# Patient Record
Sex: Female | Born: 1985 | Race: Black or African American | Hispanic: No | Marital: Single | Smoking: Never smoker
Health system: Southern US, Community
[De-identification: ages and names within clinical notes are randomized; demographics above are authoritative.]

## PROBLEM LIST (undated history)

## (undated) DIAGNOSIS — F32A Depression, unspecified: Secondary | ICD-10-CM

## (undated) DIAGNOSIS — F419 Anxiety disorder, unspecified: Secondary | ICD-10-CM

## (undated) DIAGNOSIS — Z789 Other specified health status: Secondary | ICD-10-CM

## (undated) DIAGNOSIS — I10 Essential (primary) hypertension: Secondary | ICD-10-CM

## (undated) HISTORY — DX: Anxiety disorder, unspecified: F41.9

## (undated) HISTORY — PX: WISDOM TOOTH EXTRACTION: SHX21

## (undated) HISTORY — DX: Depression, unspecified: F32.A

## (undated) HISTORY — PX: PILONIDAL CYST EXCISION: SHX744

## (undated) HISTORY — DX: Essential (primary) hypertension: I10

---
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
## 2017-03-20 ENCOUNTER — Other Ambulatory Visit (HOSPITAL_COMMUNITY): Payer: Self-pay | Admitting: Unknown Physician Specialty

## 2017-03-23 ENCOUNTER — Ambulatory Visit (HOSPITAL_COMMUNITY)
Admission: RE | Admit: 2017-03-23 | Discharge: 2017-03-23 | Disposition: A | Discharge status: Home / Self Care | Payer: 59 | Source: Ambulatory Visit | Attending: Unknown Physician Specialty | Admitting: Unknown Physician Specialty

## 2017-03-23 ENCOUNTER — Encounter (HOSPITAL_COMMUNITY): Payer: Self-pay | Admitting: *Deleted

## 2017-03-23 ENCOUNTER — Other Ambulatory Visit (HOSPITAL_COMMUNITY): Payer: Self-pay | Admitting: Unknown Physician Specialty

## 2017-03-23 DIAGNOSIS — Z3A24 24 weeks gestation of pregnancy: Secondary | ICD-10-CM | POA: Insufficient documentation

## 2017-03-23 DIAGNOSIS — Z363 Encounter for antenatal screening for malformations: Secondary | ICD-10-CM | POA: Diagnosis present

## 2017-03-23 DIAGNOSIS — O99212 Obesity complicating pregnancy, second trimester: Secondary | ICD-10-CM | POA: Insufficient documentation

## 2017-03-23 DIAGNOSIS — Z3689 Encounter for other specified antenatal screening: Secondary | ICD-10-CM

## 2017-03-23 DIAGNOSIS — Z3686 Encounter for antenatal screening for cervical length: Secondary | ICD-10-CM | POA: Diagnosis not present

## 2017-03-23 DIAGNOSIS — O26872 Cervical shortening, second trimester: Secondary | ICD-10-CM | POA: Diagnosis not present

## 2017-03-23 DIAGNOSIS — O3440 Maternal care for other abnormalities of cervix, unspecified trimester: Secondary | ICD-10-CM | POA: Insufficient documentation

## 2017-03-23 HISTORY — DX: Other specified health status: Z78.9

## 2017-03-24 ENCOUNTER — Encounter (HOSPITAL_COMMUNITY): Payer: Self-pay

## 2017-03-25 ENCOUNTER — Other Ambulatory Visit (HOSPITAL_COMMUNITY): Payer: Self-pay

## 2017-03-25 ENCOUNTER — Encounter (HOSPITAL_COMMUNITY): Payer: Self-pay

## 2017-10-01 ENCOUNTER — Encounter (HOSPITAL_COMMUNITY): Payer: Self-pay

## 2020-04-15 ENCOUNTER — Ambulatory Visit: Payer: Self-pay

## 2021-01-08 ENCOUNTER — Ambulatory Visit: Payer: Self-pay | Admitting: Pediatrics

## 2021-01-15 ENCOUNTER — Ambulatory Visit: Payer: 59 | Admitting: Nurse Practitioner

## 2021-01-15 ENCOUNTER — Encounter: Payer: Self-pay | Admitting: Nurse Practitioner

## 2021-01-15 ENCOUNTER — Other Ambulatory Visit: Payer: Self-pay

## 2021-01-15 VITALS — BP 105/68 | HR 78 | Temp 97.8°F | Ht 70.0 in | Wt 314.0 lb

## 2021-01-15 DIAGNOSIS — Z6841 Body Mass Index (BMI) 40.0 and over, adult: Secondary | ICD-10-CM | POA: Diagnosis not present

## 2021-01-15 DIAGNOSIS — F339 Major depressive disorder, recurrent, unspecified: Secondary | ICD-10-CM | POA: Diagnosis not present

## 2021-01-15 DIAGNOSIS — Z0000 Encounter for general adult medical examination without abnormal findings: Secondary | ICD-10-CM | POA: Insufficient documentation

## 2021-01-15 DIAGNOSIS — F419 Anxiety disorder, unspecified: Secondary | ICD-10-CM | POA: Insufficient documentation

## 2021-01-15 MED ORDER — SERTRALINE HCL 100 MG PO TABS: 100.0000 mg | ORAL_TABLET | Freq: Every day | ORAL | 3 refills | Status: DC

## 2021-01-15 MED ORDER — BUPROPION HCL ER (XL) 150 MG PO TB24: 150.0000 mg | ORAL_TABLET | Freq: Every day | ORAL | 3 refills | Status: DC

## 2021-01-15 NOTE — Assessment & Plan Note (Signed)
Patient had weight loss surgery last year and has lost almost 100 pounds.  I continue to provide education and reinforcement, exercise and healthy diet recommended.

## 2021-01-15 NOTE — Assessment & Plan Note (Addendum)
Completed physical exam, head to toe assessment, education provided to patient on health maintenance and preventative care. Printed hand out given. Follow up in one year.   Labs completed today-CBC, CMP, lipid panel,.

## 2021-01-15 NOTE — Patient Instructions (Signed)
Completed annual physical exam today, return in 1 year for physical.  Started patient back on Zoloft 100 mg tablet daily and Wellbutrin 150 mg tablet daily.  Follow-up 6 weeks reassess depression/anxiety Health Maintenance, Female Adopting a healthy lifestyle and getting preventive care are important in promoting health and wellness. Ask your health care provider about: The right schedule for you to have regular tests and exams. Things you can do on your own to prevent diseases and keep yourself healthy. What should I know about diet, weight, and exercise? Eat a healthy diet  Eat a diet that includes plenty of vegetables, fruits, low-fat dairy products, and lean protein. Do not eat a lot of foods that are high in solid fats, added sugars, or sodium.  Maintain a healthy weight Body mass index (BMI) is used to identify weight problems. It estimates body fat based on height and weight. Your health care provider can help determineyour BMI and help you achieve or maintain a healthy weight. Get regular exercise Get regular exercise. This is one of the most important things you can do for your health. Most adults should: Exercise for at least 150 minutes each week. The exercise should increase your heart rate and make you sweat (moderate-intensity exercise). Do strengthening exercises at least twice a week. This is in addition to the moderate-intensity exercise. Spend less time sitting. Even light physical activity can be beneficial. Watch cholesterol and blood lipids Have your blood tested for lipids and cholesterol at 34 years of age, then havethis test every 5 years. Have your cholesterol levels checked more often if: Your lipid or cholesterol levels are high. You are older than 34 years of age. You are at high risk for heart disease. What should I know about cancer screening? Depending on your health history and family history, you may need to have cancer screening at various ages. This may  include screening for: Breast cancer. Cervical cancer. Colorectal cancer. Skin cancer. Lung cancer. What should I know about heart disease, diabetes, and high blood pressure? Blood pressure and heart disease High blood pressure causes heart disease and increases the risk of stroke. This is more likely to develop in people who have high blood pressure readings, are of African descent, or are overweight. Have your blood pressure checked: Every 3-5 years if you are 16-27 years of age. Every year if you are 62 years old or older. Diabetes Have regular diabetes screenings. This checks your fasting blood sugar level. Have the screening done: Once every three years after age 43 if you are at a normal weight and have a low risk for diabetes. More often and at a younger age if you are overweight or have a high risk for diabetes. What should I know about preventing infection? Hepatitis B If you have a higher risk for hepatitis B, you should be screened for this virus. Talk with your health care provider to find out if you are at risk forhepatitis B infection. Hepatitis C Testing is recommended for: Everyone born from [birth year] through 1964. Anyone with known risk factors for hepatitis C. Sexually transmitted infections (STIs) Get screened for STIs, including gonorrhea and chlamydia, if: You are sexually active and are younger than 34 years of age. You are older than 34 years of age and your health care provider tells you that you are at risk for this type of infection. Your sexual activity has changed since you were last screened, and you are at increased risk for chlamydia or gonorrhea. Ask your  health care provider if you are at risk. Ask your health care provider about whether you are at high risk for HIV. Your health care provider may recommend a prescription medicine to help prevent HIV infection. If you choose to take medicine to prevent HIV, you should first get tested for HIV. You should then be  tested every 3 months for as long as you are taking the medicine. Pregnancy If you are about to stop having your period (premenopausal) and you may become pregnant, seek counseling before you get pregnant. Take 400 to 800 micrograms (mcg) of folic acid every day if you become pregnant. Ask for birth control (contraception) if you want to prevent pregnancy. Osteoporosis and menopause Osteoporosis is a disease in which the bones lose minerals and strength with aging. This can result in bone fractures. If you are 7 years old or older, or if you are at risk for osteoporosis and fractures, ask your health care provider if you should: Be screened for bone loss. Take a calcium or vitamin D supplement to lower your risk of fractures. Be given hormone replacement therapy (HRT) to treat symptoms of menopause. Follow these instructions at home: Lifestyle Do not use any products that contain nicotine or tobacco, such as cigarettes, e-cigarettes, and chewing tobacco. If you need help quitting, ask your health care provider. Do not use street drugs. Do not share needles. Ask your health care provider for help if you need support or information about quitting drugs. Alcohol use Do not drink alcohol if: Your health care provider tells you not to drink. You are pregnant, may be pregnant, or are planning to become pregnant. If you drink alcohol: Limit how much you use to 0-1 drink a day. Limit intake if you are breastfeeding. Be aware of how much alcohol is in your drink. In the [place] one drink equals one 12 oz bottle of beer (355 mL), one 5 oz glass of wine (148 mL), or one 1 oz glass of hard liquor (44 mL). General instructions Schedule regular health, dental, and eye exams. Stay current with your vaccines. Tell your health care provider if: You often feel depressed. You have ever been abused or do not feel safe at home. Summary Adopting a healthy lifestyle and getting preventive care are important  in promoting health and wellness. Follow your health care provider's instructions about healthy diet, exercising, and getting tested or screened for diseases. Follow your health care provider's instructions on monitoring your cholesterol and blood pressure. This information is not intended to replace advice given to you by your health care provider. Make sure you discuss any questions you have with your healthcare provider. Document Revised: 07/07/2018 Document Reviewed: 07/07/2018 Elsevier Patient Education  2022 Elsevier Inc. http://[email or web address].NIH.Gov">  Generalized Anxiety Disorder, Adult Generalized anxiety disorder (GAD) is a mental health condition. Unlike normal worries, anxiety related to GAD is not triggered by a specific event. These worries do not fade or get better with time. GAD interferes with relationships,work, and school. GAD symptoms can vary from mild to severe. People with severe GAD can have intense waves of anxiety with physical symptoms that are similar to panicattacks. What are the causes? The exact cause of GAD is not known, but the following are believed to have an impact: Differences in natural brain chemicals. Genes passed down from parents to children. Differences in the way threats are perceived. Development during childhood. Personality. What increases the risk? The following factors may make you more likely to develop this condition: Being  female. Having a family history of anxiety disorders. Being very shy. Experiencing very stressful life events, such as the death of a loved one. Having a very stressful family environment. What are the signs or symptoms? People with GAD often worry excessively about many things in their lives, such as their health and family. Symptoms may also include: Mental and emotional symptoms: Worrying excessively about natural disasters. Fear of being late. Difficulty concentrating. Fears that others are judging your  performance. Physical symptoms: Fatigue. Headaches, muscle tension, muscle twitches, trembling, or feeling shaky. Feeling like your heart is pounding or beating very fast. Feeling out of breath or like you cannot take a deep breath. Having trouble falling asleep or staying asleep, or experiencing restlessness. Sweating. Nausea, diarrhea, or irritable bowel syndrome (IBS). Behavioral symptoms: Experiencing erratic moods or irritability. Avoidance of new situations. Avoidance of people. Extreme difficulty making decisions. How is this diagnosed? This condition is diagnosed based on your symptoms and medical history. You will also have a physical exam. Your health care provider may perform tests torule out other possible causes of your symptoms. To be diagnosed with GAD, a person must have anxiety that: Is out of his or her control. Affects several different aspects of his or her life, such as work and relationships. Causes distress that makes him or her unable to take part in normal activities. Includes at least three symptoms of GAD, such as restlessness, fatigue, trouble concentrating, irritability, muscle tension, or sleep problems. Before your health care provider can confirm a diagnosis of GAD, these symptoms must be present more days than they are not, and they must last for 6 months orlonger. How is this treated? This condition may be treated with: Medicine. Antidepressant medicine is usually prescribed for long-term daily control. Anti-anxiety medicines may be added in severe cases, especially when panic attacks occur. Talk therapy (psychotherapy). Certain types of talk therapy can be helpful in treating GAD by providing support, education, and guidance. Options include: Cognitive behavioral therapy (CBT). People learn coping skills and self-calming techniques to ease their physical symptoms. They learn to identify unrealistic thoughts and behaviors and to replace them with more  appropriate thoughts and behaviors. Acceptance and commitment therapy (ACT). This treatment teaches people how to be mindful as a way to cope with unwanted thoughts and feelings. Biofeedback. This process trains you to manage your body's response (physiological response) through breathing techniques and relaxation methods. You will work with a therapist while machines are used to monitor your physical symptoms. Stress management techniques. These include yoga, meditation, and exercise. A mental health specialist can help determine which treatment is best for you. Some people see improvement with one type of therapy. However, other peoplerequire a combination of therapies. Follow these instructions at home: Lifestyle Maintain a consistent routine and schedule. Anticipate stressful situations. Create a plan, and allow extra time to work with your plan. Practice stress management or self-calming techniques that you have learned from your therapist or your health care provider. General instructions Take over-the-counter and prescription medicines only as told by your health care provider. Understand that you are likely to have setbacks. Accept this and be kind to yourself as you persist to take better care of yourself. Recognize and accept your accomplishments, even if you judge them as small. Keep all follow-up visits as told by your health care provider. This is important. Contact a health care provider if: Your symptoms do not get better. Your symptoms get worse. You have signs of depression, such as:  A persistently sad or irritable mood. Loss of enjoyment in activities that used to bring you joy. Change in weight or eating. Changes in sleeping habits. Avoiding friends or family members. Loss of energy for normal tasks. Feelings of guilt or worthlessness. Get help right away if: You have serious thoughts about hurting yourself or others. If you ever feel like you may hurt yourself or  others, or have thoughts about taking your own life, get help right away. Go to your nearest emergency department or: Call your local emergency services (911 in the [place] Call a suicide crisis helpline, such as the National Suicide Prevention Lifeline at 920-858-14451-(613)384-9976. This is open 24 hours a day in the [place] Text the Crisis Text Line at (534)149-7248741741 (in the [place] Summary Generalized anxiety disorder (GAD) is a mental health condition that involves worry that is not triggered by a specific event. People with GAD often worry excessively about many things in their lives, such as their health and family. GAD may cause symptoms such as restlessness, trouble concentrating, sleep problems, frequent sweating, nausea, diarrhea, headaches, and trembling or muscle twitching. A mental health specialist can help determine which treatment is best for you. Some people see improvement with one type of therapy. However, other people require a combination of therapies. This information is not intended to replace advice given to you by your health care provider. Make sure you discuss any questions you have with your healthcare provider. Document Revised: 05/04/2019 Document Reviewed: 05/04/2019 Elsevier Patient Education  2022 Elsevier Inc. Major Depressive Disorder, Adult Major depressive disorder is a mental health condition. This disorder affects feelings. It can also affect the body. Symptoms of this condition last most of the day, almost every day, for 2 weeks. This disorder can affect: Relationships. Daily activities, such as work and school. Activities that you normally like to do. What are the causes? The cause of this condition is not known. The disorder is likely caused by a mix of things, including: Your personality, such as being a shy person. Your behavior, or how you act toward others. Your thoughts and feelings. Too much alcohol or drugs. How you react to stress. Health and mental problems that you  have had for a long time. Things that hurt you in the past (trauma). Big changes in your life, such as divorce. What increases the risk? The following factors may make you more likely to develop this condition: Having family members with depression. Being a woman. Problems in the family. Low levels of some brain chemicals. Things that caused you pain as a child, especially if you lost a parent or were abused. A lot of stress in your life, such as from: Living without basic needs of life, such as food and shelter. Being treated poorly because of race, sex, or religion (discrimination). Health and mental problems that you have had for a long time. What are the signs or symptoms? The main symptoms of this condition are: Being sad all the time. Being grouchy all the time. Loss of interest in things and activities. Other symptoms include: Sleeping too much or too little. Eating too much or too little. Gaining or losing weight, without knowing why. Feeling tired or having low energy. Being restless and weak. Feeling hopeless, worthless, or guilty. Trouble thinking clearly or making decisions. Thoughts of hurting yourself or others, or thoughts of ending your life. Spending a lot of time alone. Inability to complete common tasks of daily life. If you have very bad MDD, you may:  Believe things that are not true. Hear, see, taste, or feel things that are not there. Have mild depression that lasts for at least 2 years. Feel very sad and hopeless. Have trouble speaking or moving. How is this treated? This condition may be treated with: Talk therapy. This teaches you to know bad thoughts, feelings, and actions and how to change them. This can also help you to communicate with others. This can be done with members of your family. Medicines. These can be used to treat worry (anxiety), depression, or low levels of chemicals in the brain. Lifestyle changes. You may need to: Limit alcohol  use. Limit drug use. Get regular exercise. Get plenty of sleep. Make healthy eating choices. Spend more time outdoors. Brain stimulation. This treatment excites the brain. This is done when symptoms are very bad or have not gotten better with other treatments. Follow these instructions at home: Activity Get regular exercise as told. Spend time outdoors as told. Make time to do the things you enjoy. Find ways to deal with stress. Try to: Meditate. Do deep breathing. Spend time in nature. Keep a journal. Return to your normal activities as told by your doctor. Ask your doctor what activities are safe for you. Alcohol and drug use If you drink alcohol: Limit how much you use to: 0-1 drink a day for women. 0-2 drinks a day for men. Be aware of how much alcohol is in your drink. In the [place] one drink equals one 12 oz bottle of beer (355 mL), one 5 oz glass of wine (148 mL), or one 1 oz glass of hard liquor (44 mL). Talk to your doctor about: Alcohol use. Alcohol can affect some medicines. Any drug use. General instructions  Take over-the-counter and prescription medicines and herbal preparations only as told by your doctor. Eat a healthy diet. Get a lot of sleep. Think about joining a support group. Your doctor may be able to suggest one. Keep all follow-up visits as told by your doctor. This is important.  Where to find more information: [employer] on Mental Illness: www.nami.org [place] [employer] of Mental Health: http://www.maynard.net/ American Psychiatric Association: www.psychiatry.org/patients-families/ Contact a doctor if: Your symptoms get worse. You get new symptoms. Get help right away if: You hurt yourself. You have serious thoughts about hurting yourself or others. You see, hear, taste, smell, or feel things that are not there. If you ever feel like you may hurt yourself or others, or have thoughts about taking your own life, get help right away. Go to  your nearest emergency department or: Call your local emergency services (911 in the [place] Call a suicide crisis helpline, such as the National Suicide Prevention Lifeline at 937-607-8342. This is open 24 hours a day in the [place] Text the Crisis Text Line at 279-016-6234 (in the [place] Summary Major depressive disorder is a mental health condition. This disorder affects feelings. Symptoms of this condition last most of the day, almost every day, for 2 weeks. The symptoms of this disorder can cause problems with relationships and with daily activities. There are treatments and support for people who get this disorder. You may need more than one type of treatment. Get help right away if you have serious thoughts about hurting yourself or others. This information is not intended to replace advice given to you by your health care provider. Make sure you discuss any questions you have with your healthcare provider. Document Revised: 06/25/2019 Document Reviewed: 06/25/2019 Elsevier Patient Education  2022  [employer].

## 2021-01-15 NOTE — Assessment & Plan Note (Signed)
History of depression.  Symptoms not well controlled, patient has been out of medication for 2 months.  Completed PHQ-9, restarted patient's medication Zoloft 100 mg tablet by mouth daily.  And Wellbutrin 150 mg tablet by mouth daily.  Follow-up in 6 weeks.  Education provided printed handouts given.  Rx sent to pharmacy.

## 2021-01-15 NOTE — Assessment & Plan Note (Signed)
History of anxiety.  Symptoms not well controlled, patient has been out of medication for 2 months.  Completed PHQ-9, restarted patient's medication Zoloft 100 mg tablet by mouth daily.  And Wellbutrin 150 mg tablet by mouth daily.  Follow-up in 6 weeks.  Education provided printed handouts given.  Rx sent to pharmacy.

## 2021-01-15 NOTE — Progress Notes (Signed)
New Patient Note  RE: Joyce Rogers MRN: [id number] DOB: 1986-05-06 Date of Office Visit: 01/15/2021  Chief Complaint: New Patient (Initial Visit), Annual Exam, Anxiety, and Depression  History of Present Illness:   Encounter for general adult medical examination with abnormal findings  Physical: Patient's last physical exam was 2 year ago .  Weight: Appropriate for height (BMI less than 27%) ;  Blood Pressure: Normal (BP less than 120/80) ;  Medical History: Patient history reviewed ; Family history reviewed ;  Allergies Reviewed: No change in current allergies ;  Medications Reviewed: Medications reviewed - no changes ;  Lipids: Normal lipid levels ; labs completed Smoking: Life-long non-smoker ;  Physical Activity: Exercises at least 3 times per week ; yes Alcohol/Drug Use: Is social-drinker ; No illicit drug use ;  Patient is not afflicted from Stress Incontinence and Urge Incontinence  Safety: reviewed ; Patient wears a seat belt, has smoke detectors, has carbon monoxide detectors, practices appropriate gun safety, and wears sunscreen with extended sun exposure. Dental Care: biannual cleanings, brushes and flosses daily. Ophthalmology/Optometry: Annual visit.  Hearing loss: none Vision impairments: yes- stigmatism  Assessment and Plan: Joyce Rogers is a 35 y.o. female with: Annual physical exam Completed physical exam, head to toe assessment, education provided to patient on health maintenance and preventative care. Printed hand out given. Follow up in one year.   Labs completed today-CBC, CMP, lipid panel,.  BMI 45.0-49.9, adult [facility]) Patient had weight loss surgery last year and has lost almost 100 pounds.  I continue to provide education and reinforcement, exercise and healthy diet recommended.  Depression, recurrent (HCC) History of depression.  Symptoms not well controlled, patient has been out of medication for 2 months.  Completed PHQ-9, restarted patient's  medication Zoloft 100 mg tablet by mouth daily.  And Wellbutrin 150 mg tablet by mouth daily.  Follow-up in 6 weeks.  Education provided printed handouts given.  Rx sent to pharmacy.   Anxiety History of anxiety.  Symptoms not well controlled, patient has been out of medication for 2 months.  Completed PHQ-9, restarted patient's medication Zoloft 100 mg tablet by mouth daily.  And Wellbutrin 150 mg tablet by mouth daily.  Follow-up in 6 weeks.  Education provided printed handouts given.  Rx sent to pharmacy.  Return in about 1 year (around 01/15/2022) for physical.  Flowsheet Row Office Visit from 01/15/2021 in [facility]  PHQ-9 Total Score 18      GAD 7 : Generalized Anxiety Score 01/15/2021  Nervous, Anxious, on Edge 2  Control/stop worrying 2  Worry too much - different things 2  Trouble relaxing 3  Restless 2  Easily annoyed or irritable 3  Afraid - awful might happen 1  Total GAD 7 Score 15  Anxiety Difficulty Very difficult     Diagnostics:   Past Medical History: Patient Active Problem List   Diagnosis Date Noted   Annual physical exam 01/15/2021   Depression, recurrent (HCC) 01/15/2021   Anxiety 01/15/2021   BMI 45.0-49.9, adult (HCC) 01/15/2021   Past Medical History:  Diagnosis Date   Anxiety    Depression    Hypertension    Medical history non-contributory    Past Surgical History: Past Surgical History:  Procedure Laterality Date   LAPAROSCOPIC GASTRIC SLEEVE RESECTION Bilateral    PILONIDAL CYST EXCISION     WISDOM TOOTH EXTRACTION     Medication List:  Current Outpatient Medications  Medication Sig Dispense Refill   buPROPion [facility]  XL) 150 MG 24 hr tablet Take 1 tablet (150 mg total) by mouth daily. 30 tablet 3   sertraline (ZOLOFT) 100 MG tablet Take 1 tablet (100 mg total) by mouth daily. 30 tablet 3   No current facility-administered medications for this visit.   Allergies: No Known Allergies Social  History: Social History   Socioeconomic History   Marital status: Single    Spouse name: Not on file   Number of children: Not on file   Years of education: Not on file   Highest education level: Not on file  Occupational History   Not on file  Tobacco Use   Smoking status: Never   Smokeless tobacco: Never  Vaping Use   Vaping Use: Never used  Substance and Sexual Activity   Alcohol use: No   Drug use: No   Sexual activity: Yes    Birth control/protection: Condom  Other Topics Concern   Not on file  Social History Narrative   Not on file   Social Determinants of Health   Financial Resource Strain: Not on file  Food Insecurity: Not on file  Transportation Needs: Not on file  Physical Activity: Not on file  Stress: Not on file  Social Connections: Not on file       Family History: Family History  Problem Relation Age of Onset   Depression Mother    Anxiety disorder Mother    Hypertension Mother    Diabetes Mother    Kidney disease Father          Review of Systems  Constitutional: Negative.   HENT: Negative.    Respiratory: Negative.    Cardiovascular: Negative.   Gastrointestinal: Negative.   Genitourinary: Negative.   Musculoskeletal: Negative.   Skin:  Negative for rash.  All other systems reviewed and are negative. Objective: BP 105/68   Pulse 78   Temp 97.8 F (36.6 C) (Temporal)   Ht 5\' 10"  (1.778 m)   Wt (!) 314 lb (142.4 kg)   LMP 01/04/2021 (Exact Date)   BMI 45.05 kg/m  Body mass index is 45.05 kg/m. Physical Exam Vitals and nursing note reviewed.  Constitutional:      Appearance: Normal appearance. She is obese.  HENT:     Head: Normocephalic.     Right Ear: Tympanic membrane, ear canal and external ear normal.     Left Ear: Tympanic membrane normal.     Nose: Nose normal.     Mouth/Throat:     Mouth: Mucous membranes are moist.     Pharynx: Oropharynx is clear.  Eyes:     Conjunctiva/sclera: Conjunctivae normal.      Pupils: Pupils are equal, round, and reactive to light.  Cardiovascular:     Rate and Rhythm: Normal rate and regular rhythm.     Pulses: Normal pulses.     Heart sounds: Normal heart sounds.  Pulmonary:     Effort: Pulmonary effort is normal.     Breath sounds: Normal breath sounds.  Abdominal:     General: Bowel sounds are normal.  Musculoskeletal:        General: Normal range of motion.  Skin:    General: Skin is warm.     Findings: No rash.  Neurological:     Mental Status: She is alert and oriented to person, place, and time.  Psychiatric:        Attention and Perception: Attention normal.        Mood and Affect: Mood is anxious  and depressed.        Behavior: Behavior normal. Behavior is cooperative.        Thought Content: Thought content is not delusional. Thought content does not include suicidal ideation.        Cognition and Memory: Cognition normal.   The plan was reviewed with the patient/family, and all questions/concerned were addressed.  It was my pleasure to see Joyce Rogers today and participate in her care. Please feel free to contact me with any questions or concerns.  Sincerely,  Lynnell Chad NP [facility]

## 2021-01-22 ENCOUNTER — Encounter: Payer: Self-pay | Admitting: Nurse Practitioner

## 2021-01-22 ENCOUNTER — Ambulatory Visit (INDEPENDENT_AMBULATORY_CARE_PROVIDER_SITE_OTHER): Payer: 59 | Admitting: Nurse Practitioner

## 2021-01-22 ENCOUNTER — Other Ambulatory Visit: Payer: Self-pay

## 2021-01-22 ENCOUNTER — Other Ambulatory Visit (HOSPITAL_COMMUNITY)
Admission: RE | Admit: 2021-01-22 | Discharge: 2021-01-22 | Disposition: A | Discharge status: Home / Self Care | Payer: 59 | Source: Ambulatory Visit | Attending: Nurse Practitioner | Admitting: Nurse Practitioner

## 2021-01-22 VITALS — BP 110/69 | HR 71 | Temp 97.4°F | Ht 70.0 in | Wt 305.0 lb

## 2021-01-22 DIAGNOSIS — Z0000 Encounter for general adult medical examination without abnormal findings: Secondary | ICD-10-CM | POA: Insufficient documentation

## 2021-01-22 NOTE — Patient Instructions (Signed)
Health Maintenance, Female Adopting a healthy lifestyle and getting preventive care are important in promoting health and wellness. Ask your health care provider about: The right schedule for you to have regular tests and exams. Things you can do on your own to prevent diseases and keep yourself healthy. What should I know about diet, weight, and exercise? Eat a healthy diet  Eat a diet that includes plenty of vegetables, fruits, low-fat dairy products, and lean protein. Do not eat a lot of foods that are high in solid fats, added sugars, or sodium.  Maintain a healthy weight Body mass index (BMI) is used to identify weight problems. It estimates body fat based on height and weight. Your health care provider can help determineyour BMI and help you achieve or maintain a healthy weight. Get regular exercise Get regular exercise. This is one of the most important things you can do for your health. Most adults should: Exercise for at least 150 minutes each week. The exercise should increase your heart rate and make you sweat (moderate-intensity exercise). Do strengthening exercises at least twice a week. This is in addition to the moderate-intensity exercise. Spend less time sitting. Even light physical activity can be beneficial. Watch cholesterol and blood lipids Have your blood tested for lipids and cholesterol at 34 years of age, then havethis test every 5 years. Have your cholesterol levels checked more often if: Your lipid or cholesterol levels are high. You are older than 34 years of age. You are at high risk for heart disease. What should I know about cancer screening? Depending on your health history and family history, you may need to have cancer screening at various ages. This may include screening for: Breast cancer. Cervical cancer. Colorectal cancer. Skin cancer. Lung cancer. What should I know about heart disease, diabetes, and high blood pressure? Blood pressure and heart  disease High blood pressure causes heart disease and increases the risk of stroke. This is more likely to develop in people who have high blood pressure readings, are of African descent, or are overweight. Have your blood pressure checked: Every 3-5 years if you are 18-39 years of age. Every year if you are 40 years old or older. Diabetes Have regular diabetes screenings. This checks your fasting blood sugar level. Have the screening done: Once every three years after age 40 if you are at a normal weight and have a low risk for diabetes. More often and at a younger age if you are overweight or have a high risk for diabetes. What should I know about preventing infection? Hepatitis B If you have a higher risk for hepatitis B, you should be screened for this virus. Talk with your health care provider to find out if you are at risk forhepatitis B infection. Hepatitis C Testing is recommended for: Everyone born from 1945 through 1965. Anyone with known risk factors for hepatitis C. Sexually transmitted infections (STIs) Get screened for STIs, including gonorrhea and chlamydia, if: You are sexually active and are younger than 34 years of age. You are older than 34 years of age and your health care provider tells you that you are at risk for this type of infection. Your sexual activity has changed since you were last screened, and you are at increased risk for chlamydia or gonorrhea. Ask your health care provider if you are at risk. Ask your health care provider about whether you are at high risk for HIV. Your health care provider may recommend a prescription medicine to help   prevent HIV infection. If you choose to take medicine to prevent HIV, you should first get tested for HIV. You should then be tested every 3 months for as long as you are taking the medicine. Pregnancy If you are about to stop having your period (premenopausal) and you may become pregnant, seek counseling before you get  pregnant. Take 400 to 800 micrograms (mcg) of folic acid every day if you become pregnant. Ask for birth control (contraception) if you want to prevent pregnancy. Osteoporosis and menopause Osteoporosis is a disease in which the bones lose minerals and strength with aging. This can result in bone fractures. If you are 65 years old or older, or if you are at risk for osteoporosis and fractures, ask your health care provider if you should: Be screened for bone loss. Take a calcium or vitamin D supplement to lower your risk of fractures. Be given hormone replacement therapy (HRT) to treat symptoms of menopause. Follow these instructions at home: Lifestyle Do not use any products that contain nicotine or tobacco, such as cigarettes, e-cigarettes, and chewing tobacco. If you need help quitting, ask your health care provider. Do not use street drugs. Do not share needles. Ask your health care provider for help if you need support or information about quitting drugs. Alcohol use Do not drink alcohol if: Your health care provider tells you not to drink. You are pregnant, may be pregnant, or are planning to become pregnant. If you drink alcohol: Limit how much you use to 0-1 drink a day. Limit intake if you are breastfeeding. Be aware of how much alcohol is in your drink. In the [place] one drink equals one 12 oz bottle of beer (355 mL), one 5 oz glass of wine (148 mL), or one 1 oz glass of hard liquor (44 mL). General instructions Schedule regular health, dental, and eye exams. Stay current with your vaccines. Tell your health care provider if: You often feel depressed. You have ever been abused or do not feel safe at home. Summary Adopting a healthy lifestyle and getting preventive care are important in promoting health and wellness. Follow your health care provider's instructions about healthy diet, exercising, and getting tested or screened for diseases. Follow your health care provider's  instructions on monitoring your cholesterol and blood pressure. This information is not intended to replace advice given to you by your health care provider. Make sure you discuss any questions you have with your healthcare provider. Document Revised: 07/07/2018 Document Reviewed: 07/07/2018 Elsevier Patient Education  2022 Elsevier Inc.  

## 2021-01-22 NOTE — Assessment & Plan Note (Signed)
Pap completed today-results pending.

## 2021-01-22 NOTE — Progress Notes (Signed)
Established Patient Office Visit  Subjective:  Patient ID: Joyce Rogers, female    DOB: April 24, 1986  Age: 35 y.o. MRN: [id number]  CC:  Chief Complaint  Patient presents with   Gynecologic Exam    HPI Joyce Rogers presents for .   Encounter for general adult medical examination Physical: Patient's last physical exam was 1 year ago .  Weight: Appropriate for height (BMI greater than 27%) ;  Blood Pressure: Normal (BP less than 120/80) ;  Medical History: Patient history reviewed ; Family history reviewed ;  Allergies Reviewed: No change in current allergies ;  Medications Reviewed: Medications reviewed - no changes ;  Lipids: Normal lipid levels ;  Smoking: Life-long non-smoker ;  Physical Activity: Exercises at least 3 times per week ;  Alcohol/Drug Use: Is a non-drinker ; No illicit drug use ;  Patient is not afflicted from Stress Incontinence and Urge Incontinence  Safety: reviewed ; Patient wears a seat belt, has smoke detectors, has carbon monoxide detectors, practices appropriate gun safety, and wears sunscreen with extended sun exposure. Dental Care: biannual cleanings, brushes and flosses daily. Ophthalmology/Optometry: Annual visit.  Hearing loss: none Vision impairments: none   Past Medical History:  Diagnosis Date   Anxiety    Depression    Hypertension    Medical history non-contributory     Past Surgical History:  Procedure Laterality Date   LAPAROSCOPIC GASTRIC SLEEVE RESECTION Bilateral    PILONIDAL CYST EXCISION     WISDOM TOOTH EXTRACTION      Family History  Problem Relation Age of Onset   Depression Mother    Anxiety disorder Mother    Hypertension Mother    Diabetes Mother    Kidney disease Father     Social History   Socioeconomic History   Marital status: Single    Spouse name: Not on file   Number of children: Not on file   Years of education: Not on file   Highest education level: Not on file  Occupational History   Not  on file  Tobacco Use   Smoking status: Never   Smokeless tobacco: Never  Vaping Use   Vaping Use: Never used  Substance and Sexual Activity   Alcohol use: No   Drug use: No   Sexual activity: Yes    Birth control/protection: Condom  Other Topics Concern   Not on file  Social History Narrative   Not on file   Social Determinants of Health   Financial Resource Strain: Not on file  Food Insecurity: Not on file  Transportation Needs: Not on file  Physical Activity: Not on file  Stress: Not on file  Social Connections: Not on file  Intimate Partner Violence: Not on file    Outpatient Medications Prior to Visit  Medication Sig Dispense Refill   buPROPion (WELLBUTRIN XL) 150 MG 24 hr tablet Take 1 tablet (150 mg total) by mouth daily. 30 tablet 3   sertraline (ZOLOFT) 100 MG tablet Take 1 tablet (100 mg total) by mouth daily. 30 tablet 3   No facility-administered medications prior to visit.    No Known Allergies  ROS Review of Systems  Constitutional: Negative.   HENT: Negative.    Respiratory: Negative.    Gastrointestinal: Negative.   Genitourinary: Negative.   Musculoskeletal: Negative.   Skin: Negative.   Neurological: Negative.   Psychiatric/Behavioral:  Negative for self-injury. The patient is nervous/anxious.   All other systems reviewed and are negative.  Objective:    Physical Exam Vitals and nursing note reviewed.  Constitutional:      General: She is awake.     Appearance: Normal appearance. She is obese.  HENT:     Head: Normocephalic.  Eyes:     Conjunctiva/sclera: Conjunctivae normal.  Cardiovascular:     Rate and Rhythm: Normal rate and regular rhythm.  Pulmonary:     Effort: Pulmonary effort is normal.     Breath sounds: Normal breath sounds.  Abdominal:     General: Bowel sounds are normal.  Skin:    Findings: No rash.  Neurological:     Mental Status: She is alert and oriented to person, place, and time.  Psychiatric:         Behavior: Behavior is cooperative.    BP 110/69   Pulse 71   Temp (!) 97.4 F (36.3 C) (Temporal)   Ht 5' 10"  (1.778 m)   Wt (!) 305 lb (138.3 kg)   LMP 01/04/2021 (Exact Date)   BMI 43.76 kg/m  Wt Readings from Last 3 Encounters:  01/22/21 (!) 305 lb (138.3 kg)  01/15/21 (!) 314 lb (142.4 kg)  03/23/17 (!) 343 lb 12.8 oz (155.9 kg)     Health Maintenance Due  Topic Date Due   HIV Screening  Never done   Hepatitis C Screening  Never done   PAP SMEAR-Modifier  Never done    There are no preventive care reminders to display for this patient.  No results found for: TSH No results found for: WBC, HGB, HCT, MCV, PLT No results found for: NA, K, CHLORIDE, CO2, GLUCOSE, BUN, CREATININE, BILITOT, ALKPHOS, AST, ALT, PROT, ALBUMIN, CALCIUM, ANIONGAP, EGFR, GFR No results found for: CHOL No results found for: HDL No results found for: LDLCALC No results found for: TRIG No results found for: CHOLHDL No results found for: [place] Office Visit from 01/22/2021 in [facility]  PHQ-9 Total Score 8       GAD 7 : Generalized Anxiety Score 01/22/2021 01/15/2021  Nervous, Anxious, on Edge 2 2  Control/stop worrying 2 2  Worry too much - different things 2 2  Trouble relaxing 2 3  Restless 2 2  Easily annoyed or irritable 3 3  Afraid - awful might happen 2 1  Total GAD 7 Score 15 15  Anxiety Difficulty Somewhat difficult Very difficult     Assessment & Plan:   Problem List Items Addressed This Visit       Other   Annual physical exam - Primary    No orders of the defined types were placed in this encounter.   Follow-up: No follow-ups on file.    Joyce Lynn, NP

## 2021-01-23 LAB — HIV ANTIBODY (ROUTINE TESTING W REFLEX): HIV Screen 4th Generation wRfx: NONREACTIVE

## 2021-01-23 LAB — HEPATITIS C ANTIBODY: Hep C Virus Ab: <0.1 s/co ratio (ref 0.0–0.9)

## 2021-01-25 LAB — CYTOLOGY - PAP
Adequacy: ABSENT
Chlamydia: NEGATIVE
Comment: NEGATIVE
Comment: NORMAL
Diagnosis: NEGATIVE
Neisseria Gonorrhea: NEGATIVE

## 2021-02-04 ENCOUNTER — Encounter: Payer: Self-pay | Admitting: Pediatrics

## 2021-02-06 ENCOUNTER — Other Ambulatory Visit: Payer: Self-pay | Admitting: *Deleted

## 2021-02-06 DIAGNOSIS — F339 Major depressive disorder, recurrent, unspecified: Secondary | ICD-10-CM

## 2021-02-06 DIAGNOSIS — F419 Anxiety disorder, unspecified: Secondary | ICD-10-CM

## 2021-02-06 MED ORDER — BUPROPION HCL ER (XL) 150 MG PO TB24: 150.0000 mg | ORAL_TABLET | Freq: Every day | ORAL | 0 refills | Status: DC

## 2021-02-06 MED ORDER — SERTRALINE HCL 100 MG PO TABS: 100.0000 mg | ORAL_TABLET | Freq: Every day | ORAL | 0 refills | Status: DC

## 2021-02-26 ENCOUNTER — Ambulatory Visit: Payer: 59 | Admitting: Nurse Practitioner

## 2021-03-12 ENCOUNTER — Ambulatory Visit: Payer: 59 | Admitting: Nurse Practitioner

## 2021-03-19 ENCOUNTER — Other Ambulatory Visit: Payer: Self-pay

## 2021-03-19 ENCOUNTER — Ambulatory Visit: Payer: 59 | Admitting: Nurse Practitioner

## 2021-03-19 ENCOUNTER — Encounter: Payer: Self-pay | Admitting: Nurse Practitioner

## 2021-03-19 DIAGNOSIS — F419 Anxiety disorder, unspecified: Secondary | ICD-10-CM

## 2021-03-19 DIAGNOSIS — F339 Major depressive disorder, recurrent, unspecified: Secondary | ICD-10-CM | POA: Diagnosis not present

## 2021-03-19 NOTE — Assessment & Plan Note (Signed)
Symptoms well controlled on current medication no changes neccessary to current dose. Follow up in 3 months.

## 2021-03-19 NOTE — Progress Notes (Signed)
Established Patient Office Visit  Subjective:  Patient ID: Joyce Rogers, female    DOB: 1986-03-09  Age: 35 y.o. MRN: [id number]  CC:  Chief Complaint  Patient presents with   Depression    HPI Joyce Rogers presents for Depression, Follow-up  She  was last seen for this 6 weeks ago. Changes made at last visit include : Restarted patients Zoloft 100 mg tablet by mouth daily   She reports good compliance with treatment. She is not having side effects.   She reports good tolerance of treatment. Current symptoms include: depressed mood She feels she is Improved since last visit.  Depression screen [facility] 2/9 03/19/2021 01/22/2021 01/15/2021  Decreased Interest 0 0 1  Down, Depressed, Hopeless 1 1 2   PHQ - 2 Score 1 1 3   Altered sleeping 0 1 2  Tired, decreased energy 1 1 2   Change in appetite 2 1 3   Feeling bad or failure about yourself  1 1 2   Trouble concentrating 2 3 3   Moving slowly or fidgety/restless 0 0 2  Suicidal thoughts 0 - 1  PHQ-9 Score 7 8 18   Difficult doing work/chores Somewhat difficult Somewhat difficult Very difficult    Anxiety, Follow-up  She was last seen for anxiety 6 weeks ago. Changes made at last visit include Zoloft 100 mg tablet by mouth daily.   She reports good compliance with treatment. She reports good tolerance of treatment. She is not having side effects.   She feels her anxiety is mild and Improved since last visit.  Symptoms: No chest pain No difficulty concentrating  No dizziness No fatigue  No feelings of losing control No insomnia  No irritable No palpitations  No panic attacks No racing thoughts  No shortness of breath No sweating  No tremors/shakes    GAD-7 Results GAD-7 Generalized Anxiety Disorder Screening Tool 03/19/2021 01/22/2021 01/15/2021  1. Feeling Nervous, Anxious, or on Edge 1 2 2   2. Not Being Able to Stop or Control Worrying 2 2 2   3. Worrying Too Much About Different Things 2 2 2   4. Trouble Relaxing 1  2 3   5. Being So Restless it's Hard To Sit Still 1 2 2   6. Becoming Easily Annoyed or Irritable 0 3 3  7. Feeling Afraid As If Something Awful Might Happen 0 2 1  Total GAD-7 Score 7 15 15   Difficulty At Work, Home, or Getting  Along With Others? Somewhat difficult Somewhat difficult Very difficult    PHQ-9 Scores PHQ9 SCORE ONLY 03/19/2021 01/22/2021 01/15/2021  PHQ-9 Total Score 7 8 18     ---------------------------------------------------------------------------------------------------   Past Medical History:  Diagnosis Date   Anxiety    Depression    Hypertension    Medical history non-contributory     Past Surgical History:  Procedure Laterality Date   LAPAROSCOPIC GASTRIC SLEEVE RESECTION Bilateral    PILONIDAL CYST EXCISION     WISDOM TOOTH EXTRACTION      Family History  Problem Relation Age of Onset   Depression Mother    Anxiety disorder Mother    Hypertension Mother    Diabetes Mother    Kidney disease Father     Social History   Socioeconomic History   Marital status: Single    Spouse name: Not on file   Number of children: Not on file   Years of education: Not on file   Highest education level: Not on file  Occupational History   Not on file  Tobacco Use   Smoking status: Never   Smokeless tobacco: Never  Vaping Use   Vaping Use: Never used  Substance and Sexual Activity   Alcohol use: No   Drug use: No   Sexual activity: Yes    Birth control/protection: Condom  Other Topics Concern   Not on file  Social History Narrative   Not on file   Social Determinants of Health   Financial Resource Strain: Not on file  Food Insecurity: Not on file  Transportation Needs: Not on file  Physical Activity: Not on file  Stress: Not on file  Social Connections: Not on file  Intimate Partner Violence: Not on file    Outpatient Medications Prior to Visit  Medication Sig Dispense Refill   buPROPion (WELLBUTRIN XL) 150 MG 24 hr tablet Take 1 tablet  (150 mg total) by mouth daily. 90 tablet 0   Cetirizine HCl 10 MG CAPS Take by mouth.     Cholecalciferol 25 MCG (1000 UT) capsule Take by mouth.     cyanocobalamin 1000 MCG tablet Take by mouth.     sertraline (ZOLOFT) 100 MG tablet Take 1 tablet (100 mg total) by mouth daily. 90 tablet 0   No facility-administered medications prior to visit.    No Known Allergies  ROS Review of Systems  Constitutional: Negative.   HENT: Negative.    Eyes: Negative.   Respiratory: Negative.    Cardiovascular: Negative.   Gastrointestinal: Negative.   Skin:  Negative for rash.  Psychiatric/Behavioral:  The patient is nervous/anxious.   All other systems reviewed and are negative.    Objective:    Physical Exam Vitals and nursing note reviewed.  Constitutional:      Appearance: Normal appearance.  HENT:     Head: Normocephalic.     Nose: Nose normal.  Eyes:     Conjunctiva/sclera: Conjunctivae normal.  Cardiovascular:     Rate and Rhythm: Normal rate and regular rhythm.  Pulmonary:     Effort: Pulmonary effort is normal.     Breath sounds: Normal breath sounds.  Abdominal:     General: Bowel sounds are normal.  Skin:    General: Skin is warm.     Findings: No rash.  Neurological:     Mental Status: She is alert and oriented to person, place, and time.  Psychiatric:        Attention and Perception: Attention and perception normal.        Mood and Affect: Mood is anxious and depressed.        Speech: Speech normal.        Behavior: Behavior normal. Behavior is cooperative.        Cognition and Memory: Cognition normal.        Judgment: Judgment normal.    BP 108/74   Pulse 64   Temp (!) 97.4 F (36.3 C) (Temporal)   Ht 6' (1.829 m)   Wt (!) 314 lb (142.4 kg)   LMP 03/05/2021   SpO2 98%   BMI 42.59 kg/m  Wt Readings from Last 3 Encounters:  03/19/21 (!) 314 lb (142.4 kg)  01/22/21 (!) 305 lb (138.3 kg)  01/15/21 (!) 314 lb (142.4 kg)     Health Maintenance Due   Topic Date Due   INFLUENZA VACCINE  02/25/2021       Assessment & Plan:   Problem List Items Addressed This Visit       Other   Depression, recurrent (HCC)    Symptoms  well controlled on Zoloft 100 mg tablet by mouth daily. No changes to current dose, follow up in 3 months.       Anxiety    Symptoms well controlled on current medication no changes neccessary to current dose. Follow up in 3 months.       No orders of the defined types were placed in this encounter.   Follow-up: Return in about 3 months (around 06/19/2021).    Daryll Drown, NP

## 2021-03-19 NOTE — Patient Instructions (Signed)
Symptoms are improving and well controlled, follow up in 3 months  [email or web address].NIH.Gov">  Generalized Anxiety Disorder, Adult Generalized anxiety disorder (GAD) is a mental health condition. Unlike normal worries, anxiety related to GAD is not triggered by a specific event. These worries do not fade or get better with time. GAD interferes with relationships,work, and school. GAD symptoms can vary from mild to severe. People with severe GAD can have intense waves of anxiety with physical symptoms that are similar to panicattacks. What are the causes? The exact cause of GAD is not known, but the following are believed to have an impact: Differences in natural brain chemicals. Genes passed down from parents to children. Differences in the way threats are perceived. Development during childhood. Personality. What increases the risk? The following factors may make you more likely to develop this condition: Being female. Having a family history of anxiety disorders. Being very shy. Experiencing very stressful life events, such as the death of a loved one. Having a very stressful family environment. What are the signs or symptoms? People with GAD often worry excessively about many things in their lives, such as their health and family. Symptoms may also include: Mental and emotional symptoms: Worrying excessively about natural disasters. Fear of being late. Difficulty concentrating. Fears that others are judging your performance. Physical symptoms: Fatigue. Headaches, muscle tension, muscle twitches, trembling, or feeling shaky. Feeling like your heart is pounding or beating very fast. Feeling out of breath or like you cannot take a deep breath. Having trouble falling asleep or staying asleep, or experiencing restlessness. Sweating. Nausea, diarrhea, or irritable bowel syndrome (IBS). Behavioral symptoms: Experiencing erratic moods or irritability. Avoidance of new  situations. Avoidance of people. Extreme difficulty making decisions. How is this diagnosed? This condition is diagnosed based on your symptoms and medical history. You will also have a physical exam. Your health care provider may perform tests torule out other possible causes of your symptoms. To be diagnosed with GAD, a person must have anxiety that: Is out of his or her control. Affects several different aspects of his or her life, such as work and relationships. Causes distress that makes him or her unable to take part in normal activities. Includes at least three symptoms of GAD, such as restlessness, fatigue, trouble concentrating, irritability, muscle tension, or sleep problems. Before your health care provider can confirm a diagnosis of GAD, these symptoms must be present more days than they are not, and they must last for 6 months orlonger. How is this treated? This condition may be treated with: Medicine. Antidepressant medicine is usually prescribed for long-term daily control. Anti-anxiety medicines may be added in severe cases, especially when panic attacks occur. Talk therapy (psychotherapy). Certain types of talk therapy can be helpful in treating GAD by providing support, education, and guidance. Options include: Cognitive behavioral therapy (CBT). People learn coping skills and self-calming techniques to ease their physical symptoms. They learn to identify unrealistic thoughts and behaviors and to replace them with more appropriate thoughts and behaviors. Acceptance and commitment therapy (ACT). This treatment teaches people how to be mindful as a way to cope with unwanted thoughts and feelings. Biofeedback. This process trains you to manage your body's response (physiological response) through breathing techniques and relaxation methods. You will work with a therapist while machines are used to monitor your physical symptoms. Stress management techniques. These include yoga,  meditation, and exercise. A mental health specialist can help determine which treatment is best for you. Some people see improvement with  one type of therapy. However, other peoplerequire a combination of therapies. Follow these instructions at home: Lifestyle Maintain a consistent routine and schedule. Anticipate stressful situations. Create a plan, and allow extra time to work with your plan. Practice stress management or self-calming techniques that you have learned from your therapist or your health care provider. General instructions Take over-the-counter and prescription medicines only as told by your health care provider. Understand that you are likely to have setbacks. Accept this and be kind to yourself as you persist to take better care of yourself. Recognize and accept your accomplishments, even if you judge them as small. Keep all follow-up visits as told by your health care provider. This is important. Contact a health care provider if: Your symptoms do not get better. Your symptoms get worse. You have signs of depression, such as: A persistently sad or irritable mood. Loss of enjoyment in activities that used to bring you joy. Change in weight or eating. Changes in sleeping habits. Avoiding friends or family members. Loss of energy for normal tasks. Feelings of guilt or worthlessness. Get help right away if: You have serious thoughts about hurting yourself or others. If you ever feel like you may hurt yourself or others, or have thoughts about taking your own life, get help right away. Go to your nearest emergency department or: Call your local emergency services (911 in the [place] Call a suicide crisis helpline, such as the National Suicide Prevention Lifeline at 516-525-6071. This is open 24 hours a day in the [place] Text the Crisis Text Line at (367)657-4338 (in the [place] Summary Generalized anxiety disorder (GAD) is a mental health condition that involves worry that is not  triggered by a specific event. People with GAD often worry excessively about many things in their lives, such as their health and family. GAD may cause symptoms such as restlessness, trouble concentrating, sleep problems, frequent sweating, nausea, diarrhea, headaches, and trembling or muscle twitching. A mental health specialist can help determine which treatment is best for you. Some people see improvement with one type of therapy. However, other people require a combination of therapies. This information is not intended to replace advice given to you by your health care provider. Make sure you discuss any questions you have with your healthcare provider. Document Revised: 05/04/2019 Document Reviewed: 05/04/2019 Elsevier Patient Education  2022 Elsevier Inc. http://APA.org/depression-guideline"> https://clinicalkey.com"> http://point-of-care.elsevierperformancemanager.com/skills/"> http://point-of-care.elsevierperformancemanager.com">  Managing Depression, Adult Depression is a mental health condition that affects your thoughts, feelings, and actions. Being diagnosed with depression can bring you relief if you did not know why you have felt or behaved a certain way. It could also leave you feeling overwhelmed with uncertainty about your future. Preparing yourself tomanage your symptoms can help you feel more positive about your future. How to manage lifestyle changes Managing stress  Stress is your body's reaction to life changes and events, both good and bad. Stress can add to your feelings of depression. Learning to manage your stresscan help lessen your feelings of depression. Try some of the following approaches to reducing your stress (stress reduction techniques): Listen to music that you enjoy and that inspires you. Try using a meditation app or take a meditation class. Develop a practice that helps you connect with your spiritual self. Walk in nature, pray, or go to a place of  worship. Do some deep breathing. To do this, inhale slowly through your nose. Pause at the top of your inhale for a few seconds and then exhale slowly, letting your  muscles relax. Practice yoga to help relax and work your muscles. Choose a stress reduction technique that suits your lifestyle and personality. These techniques take time and practice to develop. Set aside 5-15 minutes a day to do them. Therapists can offer training in these techniques. Other things you can do to manage stress include: Keeping a stress diary. Knowing your limits and saying no when you think something is too much. Paying attention to how you react to certain situations. You may not be able to control everything, but you can change your reaction. Adding humor to your life by watching funny films or TV shows. Making time for activities that you enjoy and that relax you.  Medicines Medicines, such as antidepressants, are often a part of treatment for depression. Talk with your pharmacist or health care provider about all the medicines, supplements, and herbal products that you take, their possible side effects, and what medicines and other products are safe to take together. Make sure to report any side effects you may have to your health care provider. Relationships Your health care provider may suggest family therapy, couples therapy, orindividual therapy as part of your treatment. How to recognize changes Everyone responds differently to treatment for depression. As you recover from depression, you may start to: Have more interest in doing activities. Feel less hopeless. Have more energy. Overeat less often, or have a better appetite. Have better mental focus. It is important to recognize if your depression is not getting better or is getting worse. The symptoms you had in the beginning may return, such as: Tiredness (fatigue) or low energy. Eating too much or too little. Sleeping too much or too  little. Feeling restless, agitated, or hopeless. Trouble focusing or making decisions. Unexplained physical complaints. Feeling irritable, angry, or aggressive. If you or your family members notice these symptoms coming back, let yourhealth care provider know right away. Follow these instructions at home: Activity  Try to get some form of exercise each day, such as walking, biking, swimming, or lifting weights. Practice stress reduction techniques. Engage your mind by taking a class or doing some volunteer work.  Lifestyle Get the right amount and quality of sleep. Cut down on using caffeine, tobacco, alcohol, and other potentially harmful substances. Eat a healthy diet that includes plenty of vegetables, fruits, whole grains, low-fat dairy products, and lean protein. Do not eat a lot of foods that are high in solid fats, added sugars, or salt (sodium). General instructions Take over-the-counter and prescription medicines only as told by your health care provider. Keep all follow-up visits as told by your health care provider. This is important. Where to find support Talking to others  Friends and family members can be sources of support and guidance. Talk to trusted friends or family members about your condition. Explain your symptoms to them, and let them know that you are working with a health care provider to treat your depression. Tell friends and family members how they also can behelpful. Finances Find appropriate mental health providers that fit with your financial situation. Talk with your health care provider about options to get reduced prices on your medicines. Where to find more information You can find support in your area from: Anxiety and Depression Association of America (ADAA): www.adaa.org Mental Health America: www.mentalhealthamerica.net [employer] on Mental Illness: www.nami.org Contact a health care provider if: You stop taking your antidepressant  medicines, and you have any of these symptoms: Nausea. Headache. Light-headedness. Chills and body aches. Not being able  to sleep (insomnia). You or your friends and family think your depression is getting worse. Get help right away if: You have thoughts of hurting yourself or others. If you ever feel like you may hurt yourself or others, or have thoughts about taking your own life, get help right away. Go to your nearest emergency department or: Call your local emergency services (911 in the [place] Call a suicide crisis helpline, such as the National Suicide Prevention Lifeline at (310)352-6963. This is open 24 hours a day in the [place] Text the Crisis Text Line at 913-745-5490 (in the [place] Summary If you are diagnosed with depression, preparing yourself to manage your symptoms is a good way to feel positive about your future. Work with your health care provider on a management plan that includes stress reduction techniques, medicines (if applicable), therapy, and healthy lifestyle habits. Keep talking with your health care provider about how your treatment is working. If you have thoughts about taking your own life, call a suicide crisis helpline or text a crisis text line. This information is not intended to replace advice given to you by your health care provider. Make sure you discuss any questions you have with your healthcare provider. Document Revised: 05/25/2019 Document Reviewed: 05/25/2019 Elsevier Patient Education  2022 [employer].

## 2021-03-19 NOTE — Assessment & Plan Note (Signed)
Symptoms well controlled on Zoloft 100 mg tablet by mouth daily. No changes to current dose, follow up in 3 months.

## 2021-05-24 ENCOUNTER — Other Ambulatory Visit: Payer: Self-pay | Admitting: Nurse Practitioner

## 2021-05-24 DIAGNOSIS — F419 Anxiety disorder, unspecified: Secondary | ICD-10-CM

## 2021-05-24 DIAGNOSIS — F339 Major depressive disorder, recurrent, unspecified: Secondary | ICD-10-CM

## 2021-07-24 ENCOUNTER — Encounter: Payer: Self-pay | Admitting: Nurse Practitioner

## 2021-07-26 ENCOUNTER — Ambulatory Visit: Payer: 59 | Admitting: Nurse Practitioner

## 2021-07-26 ENCOUNTER — Encounter: Payer: Self-pay | Admitting: Nurse Practitioner

## 2021-07-26 VITALS — BP 121/83 | HR 58 | Temp 97.5°F | Ht 72.0 in | Wt 317.2 lb

## 2021-07-26 DIAGNOSIS — R110 Nausea: Secondary | ICD-10-CM

## 2021-07-26 DIAGNOSIS — Z30019 Encounter for initial prescription of contraceptives, unspecified: Secondary | ICD-10-CM | POA: Diagnosis not present

## 2021-07-26 DIAGNOSIS — N920 Excessive and frequent menstruation with regular cycle: Secondary | ICD-10-CM | POA: Diagnosis not present

## 2021-07-26 DIAGNOSIS — R102 Pelvic and perineal pain: Secondary | ICD-10-CM

## 2021-07-26 DIAGNOSIS — Z23 Encounter for immunization: Secondary | ICD-10-CM

## 2021-07-26 MED ORDER — ONDANSETRON HCL 4 MG PO TABS: 4.0000 mg | ORAL_TABLET | Freq: Three times a day (TID) | ORAL | 0 refills | Status: AC | PRN

## 2021-07-26 MED ORDER — LO LOESTRIN FE 1 MG-10 MCG / 10 MCG PO TABS: 1.0000 | ORAL_TABLET | Freq: Every day | ORAL | 11 refills | Status: AC

## 2021-07-26 NOTE — Patient Instructions (Signed)
Pelvic Pain, Female Pelvic pain is pain in your lower belly (abdomen), below your belly button and between your hips. The pain may: Start all of a sudden (be acute). Keep coming back (be recurring). Last a long time (become chronic). Pelvic pain that lasts longer than 6 months is called chronic pelvic pain. There are many causes of pelvic pain. Sometimes the cause of pelvic pain is not known. Follow these instructions at home:  Take over-the-counter and prescription medicines only as told by your doctor. Rest as told by your doctor. Do not have sex if it hurts. Keep a journal of your pelvic pain. Write down: When the pain started. Where the pain is located. What seems to make the pain better or worse, such as food or your monthly period (menstrual cycle). Any symptoms you have along with the pain. Keep all follow-up visits. Contact a doctor if: Medicine does not help your pain, or your pain comes back. You have new symptoms. You have unusual discharge or bleeding from your vagina. You have a fever or chills. You are having trouble pooping (constipation). You have blood in your pee (urine) or poop (stool). Your pee smells bad. You feel weak or light-headed. Get help right away if: You have sudden pain that is very bad. You have very bad pain and also have any of these symptoms: A fever. Feeling like you may vomit (nauseous). Vomiting. Being very sweaty. You faint. These symptoms may be an emergency. Get help right away. Call your local emergency services (911 in the [place] Do not wait to see if the symptoms will go away. Do not drive yourself to the hospital. Summary Pelvic pain is pain in your lower belly (abdomen), below your belly button and between your hips. There are many causes of pelvic pain. Keep a journal of your pelvic pain. This information is not intended to replace advice given to you by your health care provider. Make sure you discuss any questions you have with  your health care provider. Document Revised: 11/20/2020 Document Reviewed: 11/20/2020 Elsevier Patient Education  2022 [employer] Menorrhagia Menorrhagia is when your monthly periods are heavy or last longer than normal. If you have this condition, bleeding and cramping may make it hard for you to do your daily activities. What are the causes? Common causes of this condition include: Growths in the womb (uterus). These are polyps or fibroids. These growths are not cancer. Problems with two hormones called estrogen and progesterone. One of the ovaries not releasing an egg during one or more months. A problem with the thyroid gland. Having a device for birth control (IUD). Side effects of some medicines, such as NSAIDs or blood thinners. A disorder that stops the blood from clotting normally. What increases the risk? You are more likely to have this condition if you have cancer of the womb. What are the signs or symptoms? Having to change your pad or tampon every 1-2 hours because it is soaked. Needing to use pads and tampons at the same time because of heavy bleeding. Needing to wake up to change your pads or tampons during the night. Passing blood clots larger than 1 inch (2.5 cm) in size. Having bleeding that lasts for more than 7 days. Having symptoms of low iron levels (anemia), such as feeling tired or having shortness of breath. How is this treated? You may not need to be treated for this condition. But if you need treatment, you may be given medicines: To reduce bleeding during  your period. These include birth control medicines. To make your blood thick. This slows bleeding. To reduce swelling. Medicines that do this include ibuprofen. That have a hormone called progestin. That make the ovaries stop working for a short time. To treat low iron levels. You will be given iron pills if you have this condition. If medicines do not work, surgery may be done. Surgery may be done  to: Remove a part of the lining of the womb. This lining is called the endometrium. This reduces bleeding during a period. Remove growths in the womb. These may be polyps or fibroids. Remove the entire lining of the womb. Remove the womb entirely. This procedure is called a hysterectomy. Follow these instructions at home: Medicines Take over-the-counter and prescription medicines only as told by your doctor. This includes iron pills. Do not change or switch medicines without asking your doctor. Do not take aspirin or medicines that contain aspirin 1 week before or during your period. Aspirin may make bleeding worse. Managing constipation Iron pills may cause trouble pooping (constipation). To prevent or treat problems when pooping, you may need to: Drink enough fluid to keep your pee (urine) pale yellow. Take over-the-counter or prescription medicines. Eat foods that are high in fiber. These include beans, whole grains, and fresh fruits and vegetables. Limit foods that are high in fat and sugar. These include fried or sweet foods. General instructions If you need to change your pad or tampon more than once every 2 hours, limit your activity until the bleeding stops. Eat healthy meals and foods that are high in iron. Foods that have a lot of iron include: Leafy green vegetables. Meat. Liver. Eggs. Whole-grain breads and cereals. Do not try to lose weight until your heavy bleeding has stopped and you have normal amounts of iron in your blood. If you need to lose weight, work with your doctor. Keep all follow-up visits. Contact a doctor if: You soak through a pad or tampon every 1 or 2 hours, and this happens every time you have a period. You need to use pads and tampons at the same time because you are bleeding so much. You are taking medicine, and: You feel like you may vomit. You vomit. You have watery poop (diarrhea). You have other problems that may be related to the medicine you  are taking. Get help right away if: You soak through more than a pad or tampon in 1 hour. You pass clots bigger than 1 inch (2.5 cm) wide. You feel short of breath. You feel like your heart is beating too fast. You feel dizzy or you faint. You feel very weak or tired. Summary Menorrhagia is when your menstrual periods are heavy or last longer than normal. You may not need to be treated for this condition. If you need treatment, you may be given medicines or have surgery. Take over-the-counter and prescription medicines only as told by your doctor. This includes iron pills. Get help right away if you soak through more than a pad or tampon in 1 hour or you pass large clots. Also, get help right away if you feel dizzy, short of breath, or very weak or tired. This information is not intended to replace advice given to you by your health care provider. Make sure you discuss any questions you have with your health care provider. Document Revised: 03/27/2020 Document Reviewed: 03/27/2020 Elsevier Patient Education  2022 [employer].

## 2021-07-26 NOTE — Assessment & Plan Note (Signed)
Unresolved symptoms.  Patient is experiencing menstrual bleed for 2 weeks.  History of PCOS.  No other history associated with current symptoms.  Completed STD labs results pending, vaginal ultrasound completed results pending.  Started patient on birth control.  2 at home pregnancy test in the last 24 hours negative.  Rx sent to pharmacy. Education provided to patient printed handouts given.  Follow-up with worsening unresolved symptoms.

## 2021-07-26 NOTE — Progress Notes (Signed)
Acute Office Visit  Subjective:    Patient ID: Joyce Rogers, female    DOB: 07-22-1986, 35 y.o.   MRN: [id number]  Chief Complaint  Patient presents with   Vaginal Bleeding    Vaginal Bleeding The patient's primary symptoms include a genital rash, pelvic pain and vaginal bleeding. The patient's pertinent negatives include no genital itching, missed menses or vaginal discharge. This is a new problem. The current episode started 1 to 4 weeks ago. The problem occurs constantly. The problem has been unchanged. The pain is moderate. The problem affects both sides. She is not pregnant. Associated symptoms include abdominal pain, back pain and nausea. Pertinent negatives include no chills, fever or flank pain. The vaginal discharge was bloody. The vaginal bleeding is typical of menses. She has not been passing clots. She has not been passing tissue. Nothing aggravates the symptoms. She has tried nothing for the symptoms. The treatment provided no relief. She is sexually active. It is unknown whether or not her partner has an STD. She uses nothing for contraception. Her menstrual history has been regular. Her past medical history is significant for an STD.    Past Medical History:  Diagnosis Date   Anxiety    Depression    Hypertension    Medical history non-contributory     Past Surgical History:  Procedure Laterality Date   LAPAROSCOPIC GASTRIC SLEEVE RESECTION Bilateral    PILONIDAL CYST EXCISION     WISDOM TOOTH EXTRACTION      Family History  Problem Relation Age of Onset   Depression Mother    Anxiety disorder Mother    Hypertension Mother    Diabetes Mother    Kidney disease Father     Social History   Socioeconomic History   Marital status: Single    Spouse name: Not on file   Number of children: Not on file   Years of education: Not on file   Highest education level: Not on file  Occupational History   Not on file  Tobacco Use   Smoking status: Never    Smokeless tobacco: Never  Vaping Use   Vaping Use: Never used  Substance and Sexual Activity   Alcohol use: No   Drug use: No   Sexual activity: Yes    Birth control/protection: Condom  Other Topics Concern   Not on file  Social History Narrative   Not on file   Social Determinants of Health   Financial Resource Strain: Not on file  Food Insecurity: Not on file  Transportation Needs: Not on file  Physical Activity: Not on file  Stress: Not on file  Social Connections: Not on file  Intimate Partner Violence: Not on file    Outpatient Medications Prior to Visit  Medication Sig Dispense Refill   buPROPion (WELLBUTRIN XL) 150 MG 24 hr tablet TAKE 1 TABLET BY MOUTH EVERY DAY 90 tablet 0   Cetirizine HCl 10 MG CAPS Take by mouth.     Cholecalciferol 25 MCG (1000 UT) capsule Take by mouth.     cyanocobalamin 1000 MCG tablet Take by mouth.     sertraline (ZOLOFT) 100 MG tablet TAKE 1 TABLET BY MOUTH EVERY DAY 90 tablet 0   No facility-administered medications prior to visit.    No Known Allergies  Review of Systems  Constitutional:  Negative for chills and fever.  Gastrointestinal:  Positive for abdominal pain and nausea.  Genitourinary:  Positive for pelvic pain and vaginal bleeding. Negative for flank  pain, missed menses and vaginal discharge.  Musculoskeletal:  Positive for back pain.  All other systems reviewed and are negative.     Objective:    Physical Exam Vitals and nursing note reviewed.  Constitutional:      Appearance: She is obese.  HENT:     Head: Normocephalic.     Right Ear: Ear canal and external ear normal.     Left Ear: Ear canal and external ear normal.     Nose: Nose normal. No congestion.     Mouth/Throat:     Mouth: Mucous membranes are moist.  Eyes:     Conjunctiva/sclera: Conjunctivae normal.  Cardiovascular:     Rate and Rhythm: Normal rate and regular rhythm.     Pulses: Normal pulses.     Heart sounds: Normal heart sounds.   Pulmonary:     Effort: Pulmonary effort is normal.     Breath sounds: Normal breath sounds.  Abdominal:     General: Bowel sounds are normal.     Tenderness: There is abdominal tenderness.  Musculoskeletal:     Lumbar back: Tenderness present.  Skin:    Findings: No rash.  Neurological:     Mental Status: She is alert and oriented to person, place, and time.  Psychiatric:        Behavior: Behavior normal.    BP 121/83    Pulse (!) 58    Temp (!) 97.5 F (36.4 C)    Ht 6' (1.829 m)    Wt (!) 317 lb 3.2 oz (143.9 kg)    SpO2 99%    BMI 43.02 kg/m  Wt Readings from Last 3 Encounters:  07/26/21 (!) 317 lb 3.2 oz (143.9 kg)  03/19/21 (!) 314 lb (142.4 kg)  01/22/21 (!) 305 lb (138.3 kg)    Health Maintenance Due  Topic Date Due   COVID-19 Vaccine (2 - Booster for Janssen series) 09/02/2020    There are no preventive care reminders to display for this patient.   No results found for: TSH No results found for: WBC, HGB, HCT, MCV, PLT No results found for: NA, K, CHLORIDE, CO2, GLUCOSE, BUN, CREATININE, BILITOT, ALKPHOS, AST, ALT, PROT, ALBUMIN, CALCIUM, ANIONGAP, EGFR, GFR No results found for: CHOL No results found for: HDL No results found for: LDLCALC No results found for: TRIG No results found for: CHOLHDL No results found for: HGBA1C     Assessment & Plan:   Problem List Items Addressed This Visit       Other   Encounter for female birth control    Started patient on Loestrin FE, education provided to patient with printed handouts given.  Rx sent to pharmacy.      Menorrhagia with regular cycle    Unresolved symptoms.  Patient is experiencing menstrual bleed for 2 weeks.  History of PCOS.  No other history associated with current symptoms.  Completed STD labs results pending, vaginal ultrasound completed results pending.  Started patient on birth control.  2 at home pregnancy test in the last 24 hours negative.  Rx sent to pharmacy. Education provided to  patient printed handouts given.  Follow-up with worsening unresolved symptoms.      Relevant Orders   US Pelvic Complete With Transvaginal   Need for immunization against influenza - Primary   Relevant Orders   Flu Vaccine QUAD 41moIM (Fluarix, Fluzone & Alfiuria Quad PF) (Completed)   Pelvic pain    Pelvic pain not well controlled.  Symptoms present in  the past few days.  Tylenol as needed for back pain, warm compress as tolerated.  Follow-up with unresolved symptoms.      Relevant Orders   Ct, Ng, Mycoplasmas NAA, Urine   US Pelvic Complete With Transvaginal   Other Visit Diagnoses     Nausea       Relevant Medications   ondansetron (ZOFRAN) 4 MG tablet        Meds ordered this encounter  Medications   ondansetron (ZOFRAN) 4 MG tablet    Sig: Take 1 tablet (4 mg total) by mouth every 8 (eight) hours as needed for nausea or vomiting.    Dispense:  20 tablet    Refill:  0    Order Specific Question:   Supervising Provider    Answer:   Jeneen Rinks   Norethindrone-Ethinyl Estradiol-Fe Biphas (LO LOESTRIN FE) 1 MG-10 MCG / 10 MCG tablet    Sig: Take 1 tablet by mouth daily.    Dispense:  28 tablet    Refill:  11    Order Specific Question:   Supervising Provider    Answer:   Claretta Fraise [[id number]]     Ivy Lynn, NP

## 2021-07-26 NOTE — Assessment & Plan Note (Signed)
Started patient on Loestrin FE, education provided to patient with printed handouts given.  Rx sent to pharmacy.

## 2021-07-26 NOTE — Assessment & Plan Note (Signed)
Pelvic pain not well controlled.  Symptoms present in the past few days.  Tylenol as needed for back pain, warm compress as tolerated.  Follow-up with unresolved symptoms.

## 2021-07-30 ENCOUNTER — Encounter: Payer: Self-pay | Admitting: Nurse Practitioner

## 2021-07-30 LAB — CT, NG, MYCOPLASMAS NAA, URINE
Chlamydia trachomatis, NAA: NEGATIVE
Mycoplasma genitalium NAA: NEGATIVE
Mycoplasma hominis NAA: POSITIVE — AB
Neisseria gonorrhoeae, NAA: NEGATIVE
Ureaplasma spp NAA: NEGATIVE

## 2021-08-01 ENCOUNTER — Other Ambulatory Visit: Payer: Self-pay | Admitting: Family Medicine

## 2021-08-01 DIAGNOSIS — N739 Female pelvic inflammatory disease, unspecified: Secondary | ICD-10-CM

## 2021-08-01 DIAGNOSIS — B9689 Other specified bacterial agents as the cause of diseases classified elsewhere: Secondary | ICD-10-CM

## 2021-08-01 MED ORDER — FLUCONAZOLE 150 MG PO TABS: 150.0000 mg | ORAL_TABLET | Freq: Once | ORAL | 0 refills | Status: AC

## 2021-08-01 MED ORDER — DOXYCYCLINE HYCLATE 100 MG PO TABS: 100.0000 mg | ORAL_TABLET | Freq: Two times a day (BID) | ORAL | 0 refills | Status: AC

## 2021-08-01 NOTE — Progress Notes (Signed)
Spoke to patient with regards to medication and treatment plan.  This particular mycoplasma infection appears to be best treated with doxycycline.  Because she demonstrated signs and symptoms consistent of a pelvic inflammatory disease process I went ahead and put her on a 2-week regimen rather than a 7-day regimen.  I confirmed that she is not pregnant nor is she breast-feeding.  We will CC her PCP as FYI.  Additionally I have reached out to [facility] to inquire as to when the patient can expect to get that pelvic ultrasound performed as she has not heard anything yet.  Meds ordered this encounter  Medications   doxycycline (VIBRA-TABS) 100 MG tablet    Sig: Take 1 tablet (100 mg total) by mouth 2 (two) times daily for 14 days.    Dispense:  28 tablet    Refill:  0   fluconazole (DIFLUCAN) 150 MG tablet    Sig: Take 1 tablet (150 mg total) by mouth once for 1 dose.    Dispense:  1 tablet    Refill:  0

## 2021-08-02 ENCOUNTER — Other Ambulatory Visit: Payer: Self-pay | Admitting: Nurse Practitioner

## 2021-08-02 MED ORDER — LEVOFLOXACIN 500 MG PO TABS: 500.0000 mg | ORAL_TABLET | Freq: Every day | ORAL | 0 refills | Status: DC

## 2021-08-12 ENCOUNTER — Other Ambulatory Visit: Payer: Self-pay | Admitting: Nurse Practitioner

## 2021-08-12 DIAGNOSIS — N939 Abnormal uterine and vaginal bleeding, unspecified: Secondary | ICD-10-CM

## 2021-08-16 ENCOUNTER — Other Ambulatory Visit: Payer: Self-pay | Admitting: Nurse Practitioner

## 2021-08-16 DIAGNOSIS — N920 Excessive and frequent menstruation with regular cycle: Secondary | ICD-10-CM

## 2021-08-16 DIAGNOSIS — R102 Pelvic and perineal pain: Secondary | ICD-10-CM

## 2021-08-19 ENCOUNTER — Other Ambulatory Visit: Payer: Self-pay

## 2021-08-19 ENCOUNTER — Ambulatory Visit (HOSPITAL_COMMUNITY)
Admission: RE | Admit: 2021-08-19 | Discharge: 2021-08-19 | Disposition: A | Discharge status: Home / Self Care | Payer: 59 | Source: Ambulatory Visit | Attending: Nurse Practitioner | Admitting: Nurse Practitioner

## 2021-08-19 DIAGNOSIS — R102 Pelvic and perineal pain: Secondary | ICD-10-CM | POA: Insufficient documentation

## 2021-08-19 DIAGNOSIS — N920 Excessive and frequent menstruation with regular cycle: Secondary | ICD-10-CM | POA: Insufficient documentation

## 2021-08-20 ENCOUNTER — Other Ambulatory Visit: Payer: Self-pay | Admitting: Nurse Practitioner

## 2021-08-20 DIAGNOSIS — N920 Excessive and frequent menstruation with regular cycle: Secondary | ICD-10-CM

## 2022-03-26 ENCOUNTER — Encounter: Payer: 59 | Admitting: Nurse Practitioner

## 2022-04-23 ENCOUNTER — Ambulatory Visit (INDEPENDENT_AMBULATORY_CARE_PROVIDER_SITE_OTHER): Payer: 59 | Admitting: Nurse Practitioner

## 2022-04-23 ENCOUNTER — Encounter: Payer: Self-pay | Admitting: Nurse Practitioner

## 2022-04-23 VITALS — BP 123/86 | HR 63 | Temp 98.6°F | Ht 70.0 in | Wt 306.0 lb

## 2022-04-23 DIAGNOSIS — Z23 Encounter for immunization: Secondary | ICD-10-CM

## 2022-04-23 DIAGNOSIS — Z0000 Encounter for general adult medical examination without abnormal findings: Secondary | ICD-10-CM

## 2022-04-23 NOTE — Patient Instructions (Signed)

## 2022-04-23 NOTE — Progress Notes (Signed)
Established Patient Office Visit  Subjective   Patient ID: Joyce Rogers, female    DOB: 08-25-1985  Age: 36 y.o. MRN: [id number]  Chief Complaint  Patient presents with   Annual Exam    Knee Pain  The incident occurred more than 1 week ago. The incident occurred at home. There was no injury mechanism. The pain is present in the right knee. The pain is at a severity of 6/10. The pain is moderate. Pertinent negatives include no loss of motion, loss of sensation, numbness or tingling. She reports no foreign bodies present. The symptoms are aggravated by movement. She has tried acetaminophen for the symptoms. The treatment provided mild relief.    .   Encounter for general adult medical examination Physical: Patient's last physical exam was 1 year ago .  Weight: Not appropriate for height (BMI greater than 27%) ;  Blood Pressure: Normal (BP less than 120/80) ;  Medical History: Patient history reviewed ; Family history reviewed ;  Allergies Reviewed: No change in current allergies ;  Medications Reviewed: Medications reviewed - no changes ;  Lipids: Normal lipid levels ; labs completed results pending Smoking: Life-long non-smoker ;  Physical Activity: Exercises at least 3 times per week ;  Alcohol/Drug Use: Is a non-drinker ; No illicit drug use ;  Patient is not afflicted from Stress Incontinence and Urge Incontinence  Safety: reviewed ; Patient wears a seat belt, has smoke detectors, has carbon monoxide detectors, practices appropriate gun safety, and wears sunscreen with extended sun exposure. Dental Care: biannual cleanings, brushes and flosses daily. Ophthalmology/Optometry: Annual visit.  Hearing loss: none Vision impairments: none    .hpiknee Patient Active Problem List   Diagnosis Date Noted   Encounter for female birth control 07/26/2021   Menorrhagia with regular cycle 07/26/2021   Need for immunization against influenza 07/26/2021   Pelvic pain 07/26/2021    Annual physical exam 01/15/2021   Depression, recurrent ([place]) 01/15/2021   Anxiety 01/15/2021   BMI 45.0-49.9, adult ([place]) 01/15/2021   Past Medical History:  Diagnosis Date   Anxiety    Depression    Hypertension    Medical history non-contributory    Past Surgical History:  Procedure Laterality Date   LAPAROSCOPIC GASTRIC SLEEVE RESECTION Bilateral    PILONIDAL CYST EXCISION     WISDOM TOOTH EXTRACTION     Social History   Tobacco Use   Smoking status: Never   Smokeless tobacco: Never  Vaping Use   Vaping Use: Never used  Substance Use Topics   Alcohol use: No   Drug use: No   Social History   Socioeconomic History   Marital status: Single    Spouse name: Not on file   Number of children: Not on file   Years of education: Not on file   Highest education level: Not on file  Occupational History   Not on file  Tobacco Use   Smoking status: Never   Smokeless tobacco: Never  Vaping Use   Vaping Use: Never used  Substance and Sexual Activity   Alcohol use: No   Drug use: No   Sexual activity: Yes    Birth control/protection: Condom  Other Topics Concern   Not on file  Social History Narrative   Not on file   Social Determinants of Health   Financial Resource Strain: Not on file  Food Insecurity: Not on file  Transportation Needs: Not on file  Physical Activity: Not on file  Stress: Not on  file  Social Connections: Not on file  Intimate Partner Violence: Not on file   Family Status  Relation Name Status   Mother  Alive   Father  Deceased   Family History  Problem Relation Age of Onset   Depression Mother    Anxiety disorder Mother    Hypertension Mother    Diabetes Mother    Kidney disease Father    No Known Allergies    Review of Systems  Constitutional: Negative.   HENT: Negative.    Eyes: Negative.   Respiratory: Negative.    Cardiovascular: Negative.   Gastrointestinal: Negative.   Genitourinary: Negative.   Musculoskeletal:  Negative.   Skin: Negative.  Negative for itching and rash.  Neurological: Negative.  Negative for tingling and numbness.  Psychiatric/Behavioral: Negative.    All other systems reviewed and are negative.     Objective:     BP 123/86   Pulse 63   Temp 98.6 F (37 C)   Ht 5' 10"  (1.778 m)   Wt (!) 306 lb (138.8 kg)   LMP 04/09/2022 (Approximate) Comment: start  SpO2 100%   BMI 43.91 kg/m  BP Readings from Last 3 Encounters:  04/23/22 123/86  07/26/21 121/83  03/19/21 108/74   Wt Readings from Last 3 Encounters:  04/23/22 (!) 306 lb (138.8 kg)  07/26/21 (!) 317 lb 3.2 oz (143.9 kg)  03/19/21 (!) 314 lb (142.4 kg)      Physical Exam Vitals and nursing note reviewed.  Constitutional:      Appearance: Normal appearance. She is obese.  HENT:     Head: Normocephalic.     Right Ear: External ear normal.     Left Ear: External ear normal.     Nose: Nose normal.     Mouth/Throat:     Mouth: Mucous membranes are moist.     Pharynx: Oropharynx is clear.  Eyes:     Conjunctiva/sclera: Conjunctivae normal.  Cardiovascular:     Rate and Rhythm: Normal rate and regular rhythm.     Pulses: Normal pulses.     Heart sounds: Normal heart sounds.  Pulmonary:     Effort: Pulmonary effort is normal.     Breath sounds: Normal breath sounds.  Abdominal:     General: Bowel sounds are normal.  Skin:    General: Skin is warm.     Findings: No erythema or rash.  Neurological:     General: No focal deficit present.     Mental Status: She is alert and oriented to person, place, and time.      No results found for any visits on 04/23/22.  Last CBC No results found for: "WBC", "HGB", "HCT", "MCV", "MCH", "RDW", "PLT" Last metabolic panel No results found for: "GLUCOSE", "NA", "K", "CL", "CO2", "BUN", "CREATININE", "EGFR", "CALCIUM", "PHOS", "PROT", "ALBUMIN", "LABGLOB", "AGRATIO", "BILITOT", "ALKPHOS", "AST", "ALT", "ANIONGAP" Last lipids No results found for: "CHOL", "HDL",  "LDLCALC", "LDLDIRECT", "TRIG", "CHOLHDL" Last hemoglobin A1c No results found for: "HGBA1C" Last thyroid functions No results found for: "TSH", "T3TOTAL", "T4TOTAL", "THYROIDAB"    The ASCVD Risk score (Arnett DK, et al., 2019) failed to calculate for the following reasons:   The 2019 ASCVD risk score is only valid for ages 74 to 48    Assessment & Plan:  Completed head to toe assessment.  Provided education to patient for health maintenance and preventative care. Labs completed CBC, CMP, lipid panel, vitamin D. Follow-up in 1 year for an annual physical exam.  Concerning patient's right knee pain Apply warm compress as needed. Rest joint. Use knee brace while walking Tylenol as needed for pain  Follow-up with unresolved symptoms.  Problem List Items Addressed This Visit       Other   Annual physical exam - Primary   Relevant Orders   CBC with Differential/Platelet   CMP14+EGFR   Lipid panel   VITAMIN D 25 Hydroxy (Vit-D Deficiency, Fractures)   Need for immunization against influenza   Relevant Orders   Flu Vaccine QUAD 58moIM (Fluarix, Fluzone & Alfiuria Quad PF) (Completed)    Return in about 1 year (around 04/24/2023) for annual physical.    OIvy Lynn NP

## 2022-04-24 LAB — CMP14+EGFR
ALT: 11 IU/L (ref 0–32)
AST: 20 IU/L (ref 0–40)
Albumin/Globulin Ratio: 1.4 (ref 1.2–2.2)
Albumin: 4 g/dL (ref 3.9–4.9)
Alkaline Phosphatase: 80 IU/L (ref 44–121)
BUN/Creatinine Ratio: 12 (ref 9–23)
BUN: 8 mg/dL (ref 6–20)
Bilirubin Total: 0.4 mg/dL (ref 0.0–1.2)
CO2: 20 mmol/L (ref 20–29)
Calcium: 9.1 mg/dL (ref 8.7–10.2)
Chloride: 105 mmol/L (ref 96–106)
Creatinine, Ser: 0.66 mg/dL (ref 0.57–1.00)
Globulin, Total: 2.8 g/dL (ref 1.5–4.5)
Glucose: 81 mg/dL (ref 70–99)
Potassium: 4.7 mmol/L (ref 3.5–5.2)
Sodium: 141 mmol/L (ref 134–144)
Total Protein: 6.8 g/dL (ref 6.0–8.5)
eGFR: 117 mL/min/{1.73_m2} (ref >59)

## 2022-04-24 LAB — VITAMIN D 25 HYDROXY (VIT D DEFICIENCY, FRACTURES): Vit D, 25-Hydroxy: 51.6 ng/mL (ref 30.0–100.0)

## 2022-04-24 LAB — LIPID PANEL
Chol/HDL Ratio: 2.5 ratio (ref 0.0–4.4)
Cholesterol, Total: 157 mg/dL (ref 100–199)
HDL: 64 mg/dL (ref >39)
LDL Chol Calc (NIH): 76 mg/dL (ref 0–99)
Triglycerides: 92 mg/dL (ref 0–149)
VLDL Cholesterol Cal: 17 mg/dL (ref 5–40)

## 2022-04-24 LAB — CBC WITH DIFFERENTIAL/PLATELET
Basophils Absolute: 0.1 10*3/uL (ref 0.0–0.2)
Basos: 1 %
EOS (ABSOLUTE): 0.1 10*3/uL (ref 0.0–0.4)
Eos: 2 %
Hematocrit: 39 % (ref 34.0–46.6)
Hemoglobin: 13.1 g/dL (ref 11.1–15.9)
Immature Grans (Abs): 0 10*3/uL (ref 0.0–0.1)
Immature Granulocytes: 0 %
Lymphocytes Absolute: 2.1 10*3/uL (ref 0.7–3.1)
Lymphs: 38 %
MCH: 27.9 pg (ref 26.6–33.0)
MCHC: 33.6 g/dL (ref 31.5–35.7)
MCV: 83 fL (ref 79–97)
Monocytes Absolute: 0.5 10*3/uL (ref 0.1–0.9)
Monocytes: 9 %
Neutrophils Absolute: 2.7 10*3/uL (ref 1.4–7.0)
Neutrophils: 50 %
Platelets: 224 10*3/uL (ref 150–450)
RBC: 4.7 x10E6/uL (ref 3.77–5.28)
RDW: 13.5 % (ref 11.7–15.4)
WBC: 5.3 10*3/uL (ref 3.4–10.8)

## 2022-07-25 ENCOUNTER — Encounter: Payer: Self-pay | Admitting: Nurse Practitioner

## 2022-08-19 ENCOUNTER — Telehealth: Payer: 59 | Admitting: Physician Assistant

## 2022-08-19 DIAGNOSIS — H109 Unspecified conjunctivitis: Secondary | ICD-10-CM

## 2022-08-19 MED ORDER — OFLOXACIN 0.3 % OP SOLN: 1.0000 [drp] | Freq: Four times a day (QID) | OPHTHALMIC | 0 refills | Status: AC

## 2022-08-19 NOTE — Progress Notes (Signed)
Virtual Visit Consent   Joyce Rogers, you are scheduled for a virtual visit with a [facility] provider today. Just as with appointments in the office, your consent must be obtained to participate. Your consent will be active for this visit and any virtual visit you may have with one of our providers in the next 365 days. If you have a MyChart account, a copy of this consent can be sent to you electronically.  As this is a virtual visit, video technology does not allow for your provider to perform a traditional examination. This may limit your provider's ability to fully assess your condition. If your provider identifies any concerns that need to be evaluated in person or the need to arrange testing (such as labs, EKG, etc.), we will make arrangements to do so. Although advances in technology are sophisticated, we cannot ensure that it will always work on either your end or our end. If the connection with a video visit is poor, the visit may have to be switched to a telephone visit. With either a video or telephone visit, we are not always able to ensure that we have a secure connection.  By engaging in this virtual visit, you consent to the provision of healthcare and authorize for your insurance to be billed (if applicable) for the services provided during this visit. Depending on your insurance coverage, you may receive a charge related to this service.  I need to obtain your verbal consent now. Are you willing to proceed with your visit today? Joyce Rogers has provided verbal consent on 08/19/2022 for a virtual visit (video or telephone). Mar Daring, PA-C  Date: 08/19/2022 11:48 AM  Virtual Visit via Video Note   I, Mar Daring, connected with  Joyce Rogers  ([id number], 03-23-1986) on 08/19/22 at 11:45 AM EST by a video-enabled telemedicine application and verified that I am speaking with the correct person using two identifiers.  Location: Patient: Virtual Visit  Location Patient: Home Provider: Virtual Visit Location Provider: Home Office   I discussed the limitations of evaluation and management by telemedicine and the availability of in person appointments. The patient expressed understanding and agreed to proceed.    History of Present Illness: Joyce Rogers is a 37 y.o. who identifies as a female who was assigned female at birth, and is being seen today for possible pink eye.  HPI: Conjunctivitis  The current episode started today. The onset was sudden. The problem occurs continuously. The problem has been gradually worsening. The problem is mild. Nothing relieves the symptoms. Nothing aggravates the symptoms. Associated symptoms include decreased vision, congestion, rhinorrhea, eye discharge, eye pain and eye redness. Pertinent negatives include no fever, no double vision, no eye itching, no photophobia, no headaches and no URI. The eye pain is mild. The right eye is affected. The eye pain is not associated with movement. The eyelid exhibits swelling.     Problems:  Patient Active Problem List   Diagnosis Date Noted   Encounter for female birth control 07/26/2021   Menorrhagia with regular cycle 07/26/2021   Need for immunization against influenza 07/26/2021   Pelvic pain 07/26/2021   Annual physical exam 01/15/2021   Depression, recurrent ([place]) 01/15/2021   Anxiety 01/15/2021   BMI 45.0-49.9, adult ([place]) 01/15/2021    Allergies: No Known Allergies Medications:  Current Outpatient Medications:    ofloxacin (OCUFLOX) 0.3 % ophthalmic solution, Place 1 drop into the right eye 4 (four) times daily. X 5 days,  Disp: 5 mL, Rfl: 0   Cetirizine HCl 10 MG CAPS, Take by mouth., Disp: , Rfl:    Norethindrone-Ethinyl Estradiol-Fe Biphas (LO LOESTRIN FE) 1 MG-10 MCG / 10 MCG tablet, Take 1 tablet by mouth daily., Disp: 28 tablet, Rfl: 11   ondansetron (ZOFRAN) 4 MG tablet, Take 1 tablet (4 mg total) by mouth every 8 (eight) hours as needed for  nausea or vomiting., Disp: 20 tablet, Rfl: 0  Observations/Objective: Patient is well-developed, well-nourished in no acute distress.  Resting comfortably at home.  Head is normocephalic, atraumatic.  No labored breathing.  Speech is clear and coherent with logical content.  Patient is alert and oriented at baseline.  Right eye is injected with swelling noted of the upper and lower eyelid with yellow-green purulent drainage noted at the lacrimal duct   Assessment and Plan: 1. Bacterial conjunctivitis of right eye - ofloxacin (OCUFLOX) 0.3 % ophthalmic solution; Place 1 drop into the right eye 4 (four) times daily. X 5 days  Dispense: 5 mL; Refill: 0  - Suspect bacterial conjunctivitis - Ocuflox prescribed - Warm compresses - Good hand hygiene - Seek in person evaluation if symptoms worsen or fail to improve   Follow Up Instructions: I discussed the assessment and treatment plan with the patient. The patient was provided an opportunity to ask questions and all were answered. The patient agreed with the plan and demonstrated an understanding of the instructions.  A copy of instructions were sent to the patient via MyChart unless otherwise noted below.    The patient was advised to call back or seek an in-person evaluation if the symptoms worsen or if the condition fails to improve as anticipated.  Time:  I spent 8 minutes with the patient via telehealth technology discussing the above problems/concerns.    Mar Daring, PA-C

## 2022-08-19 NOTE — Patient Instructions (Signed)
Joyce Rogers, thank you for joining Mar Daring, PA-C for today's virtual visit.  While this provider is not your primary care provider (PCP), if your PCP is located in our provider database this encounter information will be shared with them immediately following your visit.   [facility] account gives you access to today's visit and all your visits, tests, and labs performed at [facility] " click here if you don't have a [facility] account or go to [email or web address]  Consent: (Patient) Joyce Rogers provided verbal consent for this virtual visit at the beginning of the encounter.  Current Medications:  Current Outpatient Medications:    ofloxacin (OCUFLOX) 0.3 % ophthalmic solution, Place 1 drop into the right eye 4 (four) times daily. X 5 days, Disp: 5 mL, Rfl: 0   Cetirizine HCl 10 MG CAPS, Take by mouth., Disp: , Rfl:    Norethindrone-Ethinyl Estradiol-Fe Biphas (LO LOESTRIN FE) 1 MG-10 MCG / 10 MCG tablet, Take 1 tablet by mouth daily., Disp: 28 tablet, Rfl: 11   ondansetron (ZOFRAN) 4 MG tablet, Take 1 tablet (4 mg total) by mouth every 8 (eight) hours as needed for nausea or vomiting., Disp: 20 tablet, Rfl: 0   Medications ordered in this encounter:  Meds ordered this encounter  Medications   ofloxacin (OCUFLOX) 0.3 % ophthalmic solution    Sig: Place 1 drop into the right eye 4 (four) times daily. X 5 days    Dispense:  5 mL    Refill:  0    Order Specific Question:   Supervising Provider    Answer:   Chase Picket [[id number]]     *If you need refills on other medications prior to your next appointment, please contact your pharmacy*  Follow-Up: Call back or seek an in-person evaluation if the symptoms worsen or if the condition fails to improve as anticipated.  Tees Toh [phone]  Other Instructions  Bacterial Conjunctivitis, Adult Bacterial conjunctivitis is an infection of the clear membrane  that covers the white part of the eye and the inner surface of the eyelid (conjunctiva). When the blood vessels in the conjunctiva become inflamed, the eye becomes red or pink. The eye often feels irritated or itchy. Bacterial conjunctivitis spreads easily from person to person (is contagious). It also spreads easily from one eye to the other eye. What are the causes? This condition is caused by bacteria. You may get the infection if you come into close contact with: A person who is infected with the bacteria. Items that are contaminated with the bacteria, such as a face towel, contact lens solution, or eye makeup. What increases the risk? You are more likely to develop this condition if: You are exposed to other people who have the infection. You wear contact lenses. You have a sinus infection. You have had a recent eye injury or surgery. You have a weak body defense system (immune system). You have a medical condition that causes dry eyes. What are the signs or symptoms? Symptoms of this condition include: Thick, yellowish discharge from the eye. This may turn into a crust on the eyelid overnight and cause your eyelids to stick together. Tearing or watery eyes. Itchy eyes. Burning feeling in your eyes. Eye redness. Swollen eyelids. Blurred vision. How is this diagnosed? This condition is diagnosed based on your symptoms and medical history. Your health care provider may also take a sample of discharge from your eye to find the cause  of your infection. How is this treated? This condition may be treated with: Antibiotic eye drops or ointment to clear the infection more quickly and prevent the spread of infection to others. Antibiotic medicines taken by mouth (orally) to treat infections that do not respond to drops or ointments or that last longer than 10 days. Cool, wet cloths (cool compresses) placed on the eyes. Artificial tears applied 2-6 times a day. Follow these instructions at  home: Medicines Take or apply your antibiotic medicine as told by your health care provider. Do not stop using the antibiotic, even if your condition improves, unless directed by your health care provider. Take or apply over-the-counter and prescription medicines only as told by your health care provider. Be very careful to avoid touching the edge of your eyelid with the eye-drop bottle or the ointment tube when you apply medicines to the affected eye. This will keep you from spreading the infection to your other eye or to other people. Managing discomfort Gently wipe away any drainage from your eye with a warm, wet washcloth or a cotton ball. Apply a clean, cool compress to your eye for 10-20 minutes, 3-4 times a day. General instructions Do not wear contact lenses until the inflammation is gone and your health care provider says it is safe to wear them again. Ask your health care provider how to sterilize or replace your contact lenses before you use them again. Wear glasses until you can resume wearing contact lenses. Avoid wearing eye makeup until the inflammation is gone. Throw away any old eye cosmetics that may be contaminated. Change or wash your pillowcase every day. Do not share towels or washcloths. This may spread the infection. Wash your hands often with soap and water for at least 20 seconds and especially before touching your face or eyes. Use paper towels to dry your hands. Avoid touching or rubbing your eyes. Do not drive or use heavy machinery if your vision is blurred. Contact a health care provider if: You have a fever. Your symptoms do not get better after 10 days. Get help right away if: You have a fever and your symptoms suddenly get worse. You have severe pain when you move your eye. You have facial pain, redness, or swelling. You have a sudden loss of vision. Summary Bacterial conjunctivitis is an infection of the clear membrane that covers the white part of the eye  and the inner surface of the eyelid (conjunctiva). Bacterial conjunctivitis spreads easily from eye to eye and from person to person (is contagious). Wash your hands often with soap and water for at least 20 seconds and especially before touching your face or eyes. Use paper towels to dry your hands. Take or apply your antibiotic medicine as told by your health care provider. Do not stop using the antibiotic even if your condition improves. Contact a health care provider if you have a fever or if your symptoms do not get better after 10 days. Get help right away if you have a sudden loss of vision. This information is not intended to replace advice given to you by your health care provider. Make sure you discuss any questions you have with your health care provider. Document Revised: 10/24/2020 Document Reviewed: 10/24/2020 Elsevier Patient Education  [place].    If you have been instructed to have an in-person evaluation today at a local Urgent Care facility, please use the link below. It will take you to a list of all of our available  [facility], including address, phone number and hours of operation. Please do not delay care.  [facility]  If you or a family member do not have a primary care provider, use the link below to schedule a visit and establish care. When you choose a [facility] primary care physician or advanced practice provider, you gain a long-term partner in health. Find a Primary Care Provider  Learn more about [facility]'s in-office and virtual care options: [facility]

## 2022-08-24 ENCOUNTER — Telehealth: Payer: 59 | Admitting: Urgent Care

## 2022-08-24 ENCOUNTER — Telehealth: Payer: 59

## 2022-08-24 DIAGNOSIS — J069 Acute upper respiratory infection, unspecified: Secondary | ICD-10-CM

## 2022-08-24 DIAGNOSIS — B309 Viral conjunctivitis, unspecified: Secondary | ICD-10-CM

## 2022-08-24 MED ORDER — AZELASTINE HCL 0.05 % OP SOLN: 1.0000 [drp] | Freq: Two times a day (BID) | OPHTHALMIC | 0 refills | Status: AC

## 2022-08-24 NOTE — Patient Instructions (Signed)
  Joyce Rogers, thank you for joining Joyce Malling, PA for today's virtual visit.  While this provider is not your primary care provider (PCP), if your PCP is located in our provider database this encounter information will be shared with them immediately following your visit.   [name] account gives you access to today's visit and all your visits, tests, and labs performed at [facility] " click here if you don't have a [facility] account or go to [email or web address]  Consent: (Patient) Joyce Rogers provided verbal consent for this virtual visit at the beginning of the encounter.  Current Medications:  Current Outpatient Medications:    azelastine (OPTIVAR) 0.05 % ophthalmic solution, Place 1 drop into both eyes 2 (two) times daily., Disp: 6 mL, Rfl: 0   Cetirizine HCl 10 MG CAPS, Take by mouth., Disp: , Rfl:    Norethindrone-Ethinyl Estradiol-Fe Biphas (LO LOESTRIN FE) 1 MG-10 MCG / 10 MCG tablet, Take 1 tablet by mouth daily., Disp: 28 tablet, Rfl: 11   ofloxacin (OCUFLOX) 0.3 % ophthalmic solution, Place 1 drop into the right eye 4 (four) times daily. X 5 days, Disp: 5 mL, Rfl: 0   ondansetron (ZOFRAN) 4 MG tablet, Take 1 tablet (4 mg total) by mouth every 8 (eight) hours as needed for nausea or vomiting., Disp: 20 tablet, Rfl: 0   Medications ordered in this encounter:  Meds ordered this encounter  Medications   azelastine (OPTIVAR) 0.05 % ophthalmic solution    Sig: Place 1 drop into both eyes 2 (two) times daily.    Dispense:  6 mL    Refill:  0    Order Specific Question:   Supervising Provider    Answer:   Chase Picket [[id number]]     *If you need refills on other medications prior to your next appointment, please contact your pharmacy*  Follow-Up: Call back or seek an in-person evaluation if the symptoms worsen or if the condition fails to improve as anticipated.  [place] [phone]  Other  Instructions Use warm washcloth with a dollop of [employer] baby shampoo. Gently massage and cleanse your eyes to remove any impurities. Do this several times daily. Avoid touching eyes, this is contagious. Wash hands frequently. Use eye drops twice daily until symptoms resolve. Purchase OTC budesonide nasal spray (Rhinocort) and use in addition to your saline spray. Continue your zyrtec in the morning, take 5mg  Xyzal at night time to help with your symptoms. Read the attached handouts.    If you have been instructed to have an in-person evaluation today at a local Urgent Care facility, please use the link below. It will take you to a list of all of our available [facility], including address, phone number and hours of operation. Please do not delay care.  [facility]  If you or a family member do not have a primary care provider, use the link below to schedule a visit and establish care. When you choose a [facility] primary care physician or advanced practice provider, you gain a long-term partner in health. Find a Primary Care Provider  Learn more about 's in-office and virtual care options: [facility]

## 2022-08-24 NOTE — Progress Notes (Signed)
Virtual Visit Consent   Joyce Rogers, you are scheduled for a virtual visit with a [facility] provider today. Just as with appointments in the office, your consent must be obtained to participate. Your consent will be active for this visit and any virtual visit you may have with one of our providers in the next 365 days. If you have a MyChart account, a copy of this consent can be sent to you electronically.  As this is a virtual visit, video technology does not allow for your provider to perform a traditional examination. This may limit your provider's ability to fully assess your condition. If your provider identifies any concerns that need to be evaluated in person or the need to arrange testing (such as labs, EKG, etc.), we will make arrangements to do so. Although advances in technology are sophisticated, we cannot ensure that it will always work on either your end or our end. If the connection with a video visit is poor, the visit may have to be switched to a telephone visit. With either a video or telephone visit, we are not always able to ensure that we have a secure connection.  By engaging in this virtual visit, you consent to the provision of healthcare and authorize for your insurance to be billed (if applicable) for the services provided during this visit. Depending on your insurance coverage, you may receive a charge related to this service.  I need to obtain your verbal consent now. Are you willing to proceed with your visit today? Joyce Rogers has provided verbal consent on 08/24/2022 for a virtual visit (video or telephone). Joyce Malling, PA  Date: 08/24/2022 9:17 AM  Virtual Visit via Video Note   I, Joyce Rogers, connected with  Joyce Rogers  ([id number], 15-Dec-1985) on 08/24/22 at  8:30 AM EST by a video-enabled telemedicine application and verified that I am speaking with the correct person using two identifiers.  Location: Patient: Virtual Visit Location  Patient: Home Provider: Virtual Visit Location Provider: Home Office   I discussed the limitations of evaluation and management by telemedicine and the availability of in person appointments. The patient expressed understanding and agreed to proceed.    History of Present Illness: Joyce Rogers is a 37 y.o. who identifies as a female who was assigned female at birth, and is being seen today for URI and red eyes.  HPI: 37yo female presents today due to concerns of URI sx in addition to B red eyes. Was seen via video visit due to R eye redness on Tuesday, 08/19/22. At that time, her only symptoms was R eye erythema with discharge. Pt was prescribed ocuflox eye drops. Pt states she started using it immediately, but woke up on Wednesday with the L eye also affected. She then started using the drops on both eyes. Feels this has helped slightly with the drainage, but they are still both red, irritated and itchy. She has used OTC visine with some relief to the redness. Additionally, pt states over the past two days, she has also developed a scratchy through, ear pressure, sinus congestion and rhinorrhea. It is clear. She does have hx of allergies. Takes 10mg  zyrtec BID. Also uses nasal saline spray. Took a home covid test last night which was negative. Has a 36yo at home who is asx.    Problems:  Patient Active Problem List   Diagnosis Date Noted   Encounter for female birth control 07/26/2021   Menorrhagia with regular  cycle 07/26/2021   Need for immunization against influenza 07/26/2021   Pelvic pain 07/26/2021   Annual physical exam 01/15/2021   Depression, recurrent ([place]) 01/15/2021   Anxiety 01/15/2021   BMI 45.0-49.9, adult ([place]) 01/15/2021    Allergies: No Known Allergies Medications:  Current Outpatient Medications:    azelastine (OPTIVAR) 0.05 % ophthalmic solution, Place 1 drop into both eyes 2 (two) times daily., Disp: 6 mL, Rfl: 0   Cetirizine HCl 10 MG CAPS, Take by mouth., Disp: ,  Rfl:    Norethindrone-Ethinyl Estradiol-Fe Biphas (LO LOESTRIN FE) 1 MG-10 MCG / 10 MCG tablet, Take 1 tablet by mouth daily., Disp: 28 tablet, Rfl: 11   ofloxacin (OCUFLOX) 0.3 % ophthalmic solution, Place 1 drop into the right eye 4 (four) times daily. X 5 days, Disp: 5 mL, Rfl: 0   ondansetron (ZOFRAN) 4 MG tablet, Take 1 tablet (4 mg total) by mouth every 8 (eight) hours as needed for nausea or vomiting., Disp: 20 tablet, Rfl: 0  Observations/Objective: Patient is well-developed, well-nourished in no acute distress.  Resting comfortably at home.  Head is normocephalic, atraumatic.  No labored breathing. No cough Speech is clear and coherent with logical content.  Patient is alert and oriented at baseline.  B sclera injected without ciliary flush.   Assessment and Plan: 1. Viral conjunctivitis, both eyes  2. Upper respiratory infection, viral  Given lack of response to abx eye drops, this favors viral connjunctivitis as the cause. Suspect adenovirus as cause of her URI sx as well. Supportive measures discussed. Use warm washcloth with a dollop of [employer] baby shampoo. Gently massage and cleanse your eyes to remove any impurities. Do this several times daily. Avoid touching eyes, this is contagious. Wash hands frequently. Use eye drops twice daily until symptoms resolve. Purchase OTC budesonide nasal spray (Rhinocort) and use in addition to your saline spray. Continue your zyrtec in the morning, take 5mg  Xyzal at night time to help with your symptoms. Read the attached handouts.   Follow Up Instructions: I discussed the assessment and treatment plan with the patient. The patient was provided an opportunity to ask questions and all were answered. The patient agreed with the plan and demonstrated an understanding of the instructions.  A copy of instructions were sent to the patient via MyChart unless otherwise noted below.    The patient was advised to call back or seek an  in-person evaluation if the symptoms worsen or if the condition fails to improve as anticipated.  Time:  I spent 9 minutes with the patient via telehealth technology discussing the above problems/concerns.    [place], PA

## 2022-08-27 ENCOUNTER — Ambulatory Visit
Admission: RE | Admit: 2022-08-27 | Discharge: 2022-08-27 | Disposition: A | Discharge status: Home / Self Care | Payer: 59 | Source: Ambulatory Visit | Attending: Nurse Practitioner | Admitting: Nurse Practitioner

## 2022-08-27 VITALS — BP 137/92 | HR 72 | Temp 98.6°F | Resp 18 | Ht 70.0 in | Wt 305.0 lb

## 2022-08-27 DIAGNOSIS — H66001 Acute suppurative otitis media without spontaneous rupture of ear drum, right ear: Secondary | ICD-10-CM | POA: Diagnosis not present

## 2022-08-27 DIAGNOSIS — J0100 Acute maxillary sinusitis, unspecified: Secondary | ICD-10-CM

## 2022-08-27 DIAGNOSIS — J029 Acute pharyngitis, unspecified: Secondary | ICD-10-CM

## 2022-08-27 DIAGNOSIS — B379 Candidiasis, unspecified: Secondary | ICD-10-CM

## 2022-08-27 LAB — POCT RAPID STREP A (OFFICE): Rapid Strep A Screen: NEGATIVE

## 2022-08-27 MED ORDER — FLUCONAZOLE 150 MG PO TABS: 150.0000 mg | ORAL_TABLET | Freq: Every day | ORAL | 0 refills | Status: AC

## 2022-08-27 MED ORDER — AMOXICILLIN-POT CLAVULANATE 875-125 MG PO TABS: 1.0000 | ORAL_TABLET | Freq: Two times a day (BID) | ORAL | 0 refills | Status: AC

## 2022-08-27 MED ORDER — LIDOCAINE VISCOUS HCL 2 % MT SOLN: 15.0000 mL | Freq: Four times a day (QID) | OROMUCOSAL | 0 refills | Status: AC | PRN

## 2022-08-27 MED ORDER — PREDNISONE 20 MG PO TABS: 40.0000 mg | ORAL_TABLET | Freq: Every day | ORAL | 0 refills | Status: AC

## 2022-08-27 NOTE — ED Provider Notes (Addendum)
[facility]    CSN: [id number] Arrival date & time: 08/27/22  1010      History   Chief Complaint Chief Complaint  Patient presents with   Sore Throat    Stopped up ears, cough, restlessness - Entered by patient    HPI Joyce Rogers is a 37 y.o. female who presents for evaluation of URI symptoms for 7-8 days. Patient reports associated symptoms of sore throat, sinus pressure/pain, bilateral ear fullness, cough, intermittent eye redness. Denies N/V/D, fevers, body aches, shortness of breath. Patient does not have a hx of asthma or smoking. No known sick contacts and no recent travel. Pt is vaccinated for COVID. Pt is vaccinated for flu this season.  Patient did a televisit on 1/23 prescribed ofloxacin antibiotic eyedrops for possible bacterial conjunctivitis.  She did another visit on 1/28 and was advised to stop antibiotic eyedrops and was prescribed antihistamine eyedrops which she has been using with improvement.  She is also been using steroid nasal sprays and allergy medicine as well as DayQuil/NyQuil.  Pt has no other concerns at this time.    Sore Throat    Past Medical History:  Diagnosis Date   Anxiety    Depression    Hypertension    Medical history non-contributory     Patient Active Problem List   Diagnosis Date Noted   Encounter for female birth control 07/26/2021   Menorrhagia with regular cycle 07/26/2021   Need for immunization against influenza 07/26/2021   Pelvic pain 07/26/2021   Annual physical exam 01/15/2021   Depression, recurrent ([place]) 01/15/2021   Anxiety 01/15/2021   BMI 45.0-49.9, adult ([place]) 01/15/2021    Past Surgical History:  Procedure Laterality Date   LAPAROSCOPIC GASTRIC SLEEVE RESECTION Bilateral    PILONIDAL CYST EXCISION     WISDOM TOOTH EXTRACTION      OB History     Gravida  2   Para      Term      Preterm      AB  1   Living         SAB  1   IAB      Ectopic      Multiple      Live Births                Home Medications    Prior to Admission medications   Medication Sig Start Date End Date Taking? Authorizing Provider  amoxicillin-clavulanate (AUGMENTIN) 875-125 MG tablet Take 1 tablet by mouth every 12 (twelve) hours for 10 days. 08/27/22 09/06/22 Yes Melynda Ripple, NP  azelastine (OPTIVAR) 0.05 % ophthalmic solution Place 1 drop into both eyes 2 (two) times daily. 08/24/22  Yes Crain, Whitney L, PA  Cetirizine HCl 10 MG CAPS Take by mouth.   Yes [provider]  fluconazole (DIFLUCAN) 150 MG tablet Take 1 tablet (150 mg total) by mouth daily. Take first dose on day 3 of antibiotics and may repeat in 3 days if symptoms persist 08/27/22  Yes Melynda Ripple, NP  lidocaine (XYLOCAINE) 2 % solution Use as directed 15 mLs in the mouth or throat every 6 (six) hours as needed (Sore throat). Gargle and spit do not swallow 08/27/22  Yes Melynda Ripple, NP  Norethindrone-Ethinyl Estradiol-Fe Biphas (LO LOESTRIN FE) 1 MG-10 MCG / 10 MCG tablet Take 1 tablet by mouth daily. 07/26/21  Yes Ivy Lynn, NP  ofloxacin (OCUFLOX) 0.3 % ophthalmic solution Place 1 drop into  the right eye 4 (four) times daily. X 5 days 08/19/22  Yes Mar Daring, PA-C  predniSONE (DELTASONE) 20 MG tablet Take 2 tablets (40 mg total) by mouth daily with breakfast for 5 days. 08/27/22 09/01/22 Yes Melynda Ripple, NP  ondansetron (ZOFRAN) 4 MG tablet Take 1 tablet (4 mg total) by mouth every 8 (eight) hours as needed for nausea or vomiting. 07/26/21   Ivy Lynn, NP    Family History Family History  Problem Relation Age of Onset   Depression Mother    Anxiety disorder Mother    Hypertension Mother    Diabetes Mother    Kidney disease Father     Social History Social History   Tobacco Use   Smoking status: Never   Smokeless tobacco: Never  Vaping Use   Vaping Use: Never used  Substance Use Topics   Alcohol use: No   Drug use: No     Allergies   Patient has no known  allergies.   Review of Systems Review of Systems  HENT:  Positive for congestion, ear pain and sore throat.   Eyes:  Positive for redness.  Respiratory:  Positive for cough.      Physical Exam Triage Vital Signs ED Triage Vitals  Enc Vitals Group     BP 08/27/22 1026 (!) 137/92     Pulse Rate 08/27/22 1026 72     Resp 08/27/22 1026 18     Temp 08/27/22 1026 98.6 F (37 C)     Temp Source 08/27/22 1026 Oral     SpO2 08/27/22 1026 98 %     Weight 08/27/22 1025 (!) 305 lb (138.3 kg)     Height 08/27/22 1025 5\' 10"  (1.778 m)     Head Circumference --      Peak Flow --      Pain Score 08/27/22 1025 8     Pain Loc --      Pain Edu? --      Excl. in [place]? --    No data found.  Updated Vital Signs BP (!) 137/92 (BP Location: Right Arm)   Pulse 72   Temp 98.6 F (37 C) (Oral)   Resp 18   Ht 5\' 10"  (1.778 m)   Wt (!) 305 lb (138.3 kg)   LMP 08/20/2022   SpO2 98%   BMI 43.76 kg/m   Visual Acuity Right Eye Distance:   Left Eye Distance:   Bilateral Distance:    Right Eye Near:   Left Eye Near:    Bilateral Near:     Physical Exam Vitals and nursing note reviewed.  Constitutional:      General: She is not in acute distress.    Appearance: She is well-developed. She is not ill-appearing.  HENT:     Head: Normocephalic and atraumatic.     Right Ear: Ear canal normal. Tympanic membrane is erythematous.     Left Ear: Ear canal normal.     Nose: Congestion present.     Right Turbinates: Swollen and pale.     Left Turbinates: Swollen and pale.     Right Sinus: Maxillary sinus tenderness present. No frontal sinus tenderness.     Left Sinus: Maxillary sinus tenderness present. No frontal sinus tenderness.     Mouth/Throat:     Mouth: Mucous membranes are moist.     Pharynx: Oropharynx is clear. Uvula midline. Posterior oropharyngeal erythema present.     Tonsils: No tonsillar exudate or tonsillar  abscesses.  Eyes:     Extraocular Movements: Extraocular movements  intact.     Conjunctiva/sclera: Conjunctivae normal.     Pupils: Pupils are equal, round, and reactive to light.  Cardiovascular:     Rate and Rhythm: Normal rate and regular rhythm.     Heart sounds: Normal heart sounds.  Pulmonary:     Effort: Pulmonary effort is normal.     Breath sounds: Normal breath sounds.  Musculoskeletal:     Cervical back: Normal range of motion and neck supple.  Lymphadenopathy:     Cervical: No cervical adenopathy.  Skin:    General: Skin is warm and dry.  Neurological:     General: No focal deficit present.     Mental Status: She is alert and oriented to person, place, and time.  Psychiatric:        Mood and Affect: Mood normal.        Behavior: Behavior normal.      UC Treatments / Results  Labs (all labs ordered are listed, but only abnormal results are displayed) Labs Reviewed  POCT RAPID STREP A (OFFICE) - Normal    EKG   Radiology No results found.  Procedures Procedures (including critical care time)  Medications Ordered in UC Medications - No data to display  Initial Impression / Assessment and Plan / UC Course  I have reviewed the triage vital signs and the nursing notes.  Pertinent labs & imaging results that were available during my care of the patient were reviewed by me and considered in my medical decision making (see chart for details).     Reviewed exam and symptoms with patient.  No red flags on exam. Negative rapid strep Start Augmentin for right OM and sinusitis Continue previously prescribed nasal sprays as needed Prednisone for 5 days Discussed viral conjunctivitis and she will continue prescribed antihistamine eyedrops as needed.  Warm compresses as needed. Lidocaine gargle as needed. Continue over-the-counter cough medication as needed Rest and fluids Fluid can for history of antibiotic induced yeast infections Follow-up with PCP in 2 days for recheck ER precautions reviewed and patient verbalized  understanding Final Clinical Impressions(s) / UC Diagnoses   Final diagnoses:  Sore throat  Acute suppurative otitis media of right ear without spontaneous rupture of tympanic membrane, recurrence not specified  Acute maxillary sinusitis, recurrence not specified  Antibiotic-induced yeast infection     Discharge Instructions      Augmentin twice daily for 10 days Lidocaine gargle as needed.  Gargle and spit do not swallow Prednisone daily for 5 days Continue previously prescribed nasal spray and eyedrops as needed Rest and fluids Follow-up with your PCP in 2 days for recheck Please go to the emergency room if you have any worsening symptoms    ED Prescriptions     Medication Sig Dispense Auth. Provider   amoxicillin-clavulanate (AUGMENTIN) 875-125 MG tablet Take 1 tablet by mouth every 12 (twelve) hours for 10 days. 20 tablet Radford Pax, NP   predniSONE (DELTASONE) 20 MG tablet Take 2 tablets (40 mg total) by mouth daily with breakfast for 5 days. 10 tablet Radford Pax, NP   lidocaine (XYLOCAINE) 2 % solution Use as directed 15 mLs in the mouth or throat every 6 (six) hours as needed (Sore throat). Gargle and spit do not swallow 100 mL Radford Pax, NP   fluconazole (DIFLUCAN) 150 MG tablet Take 1 tablet (150 mg total) by mouth daily. Take first dose on day 3 of  antibiotics and may repeat in 3 days if symptoms persist 2 tablet Melynda Ripple, NP      PDMP not reviewed this encounter.   Melynda Ripple, NP 08/27/22 1051    Melynda Ripple, NP 08/27/22 1052

## 2022-08-27 NOTE — Discharge Instructions (Signed)
Augmentin twice daily for 10 days Lidocaine gargle as needed.  Gargle and spit do not swallow Prednisone daily for 5 days Continue previously prescribed nasal spray and eyedrops as needed Rest and fluids Follow-up with your PCP in 2 days for recheck Please go to the emergency room if you have any worsening symptoms

## 2022-08-27 NOTE — ED Triage Notes (Signed)
Pt states that she has a sore throat, cough, nasal congestion and bilateral ear fullness. X1 week

## 2023-01-11 IMAGING — US US PELVIS COMPLETE WITH TRANSVAGINAL
1 series · 14 of 25 positions shown · non-contrast
Comparison: Obstetrical ultrasound dated 03/23/2017

CLINICAL DATA: Pelvic pain and menorrhagia.

EXAM:
TRANSABDOMINAL AND TRANSVAGINAL ULTRASOUND OF PELVIS
TECHNIQUE: Both transabdominal and transvaginal ultrasound examinations of the
pelvis were performed. Transabdominal technique was performed for
global imaging of the pelvis including uterus, ovaries, adnexal
regions, and pelvic cul-de-sac. It was necessary to proceed with
endovaginal exam following the transabdominal exam to visualize the
uterus and ovaries in better detail.

[Series 1: us pelvic complete with transvaginal · 58 acquisitions, 14 frames shown]
[im 1/58]
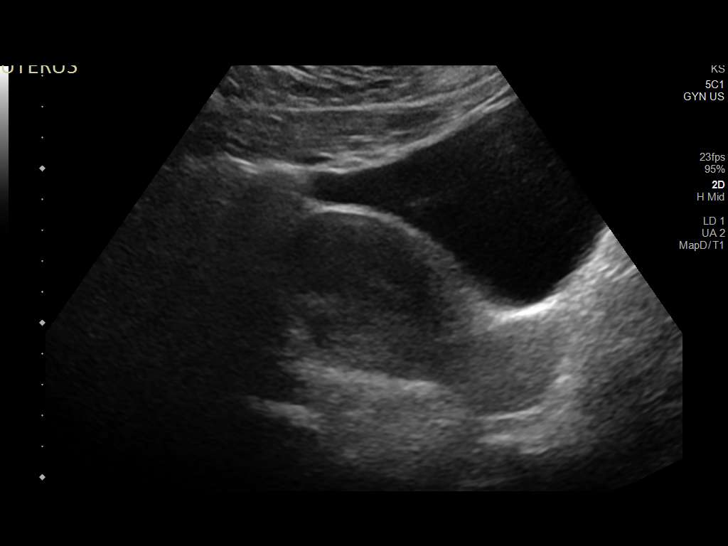
[im 5/58]
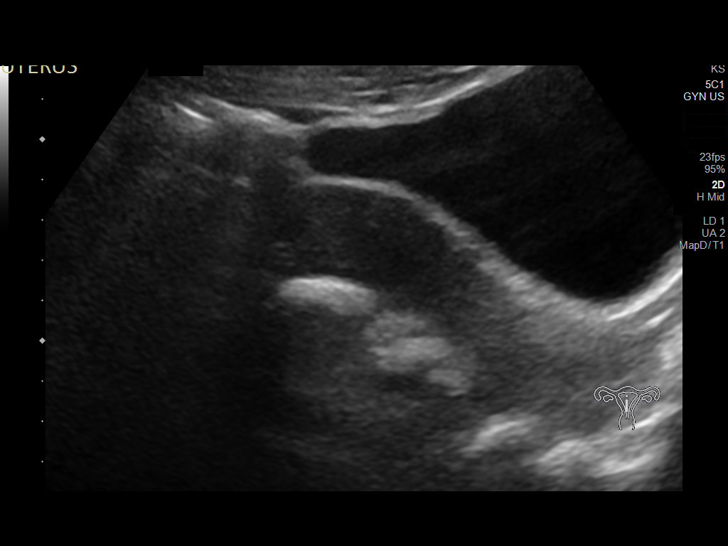
[im 10/58]
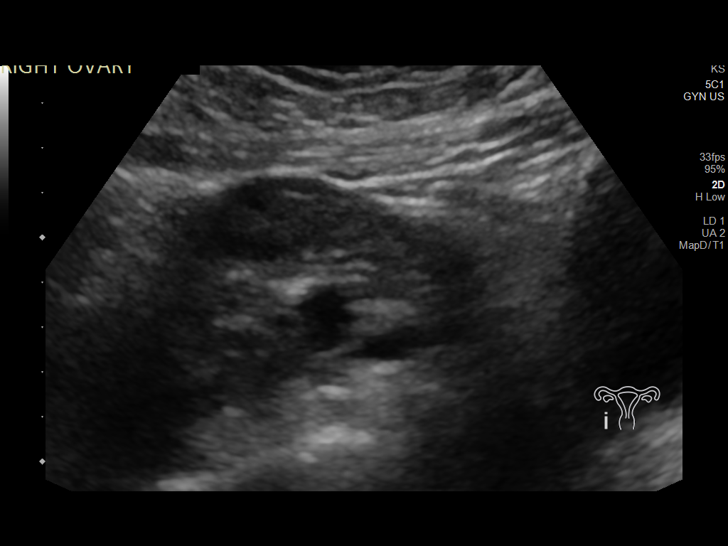
[im 15/58]
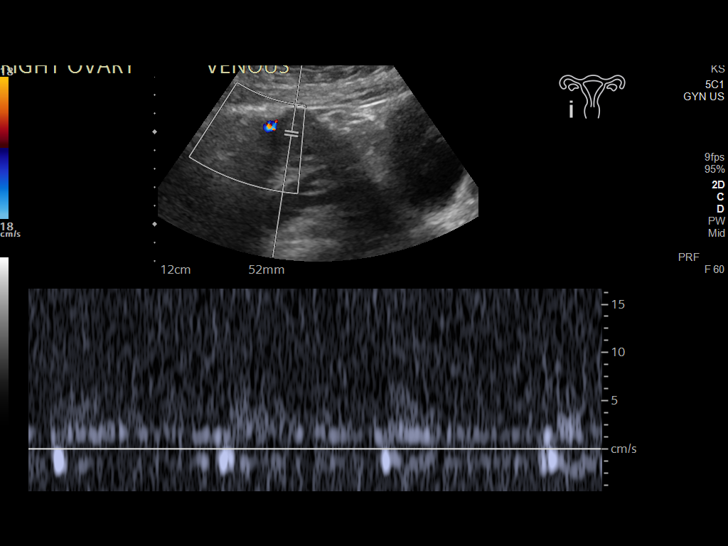
[im 20/58]
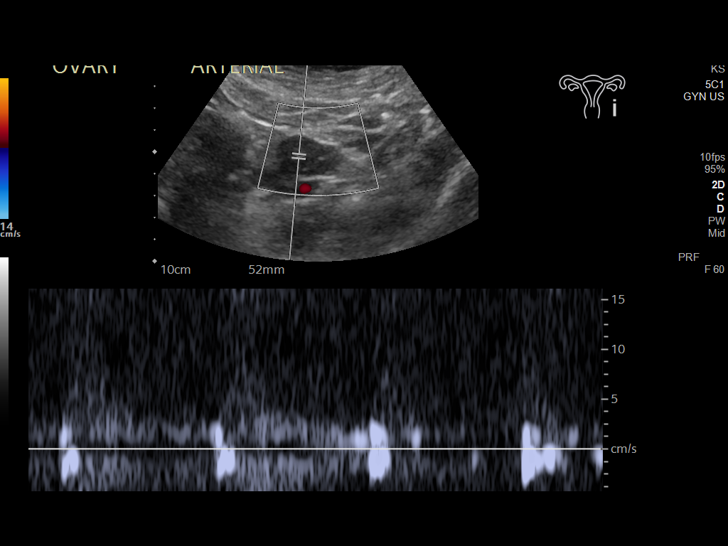
[im 22/58]
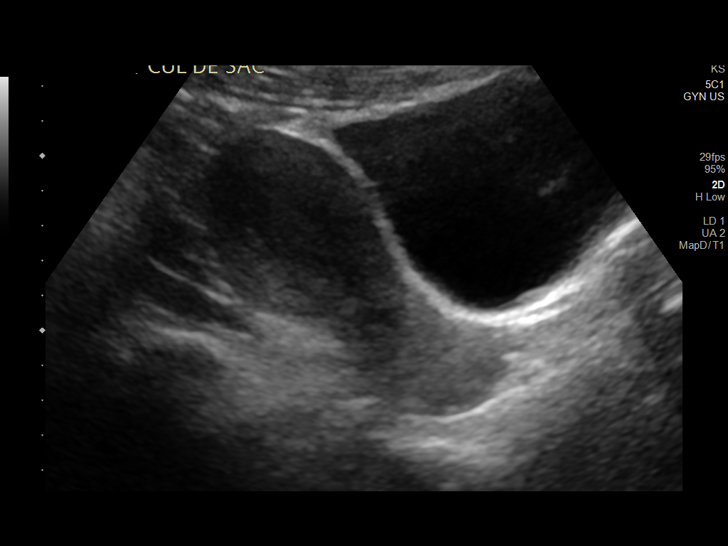
[im 27/58]
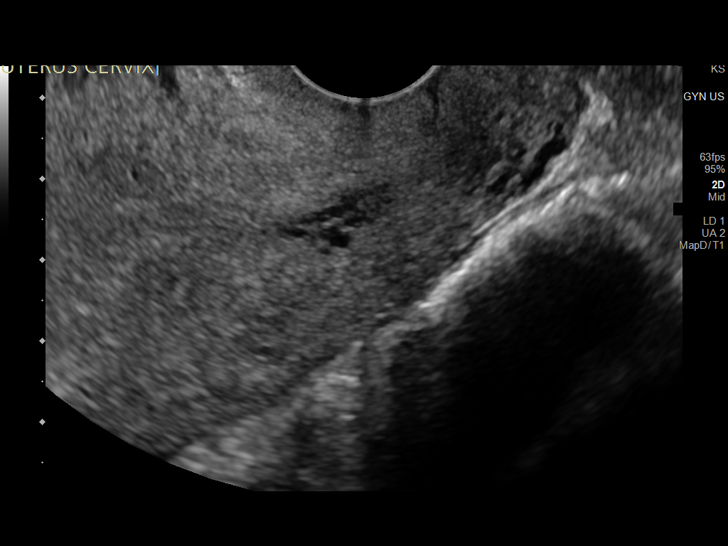
[im 31/58]
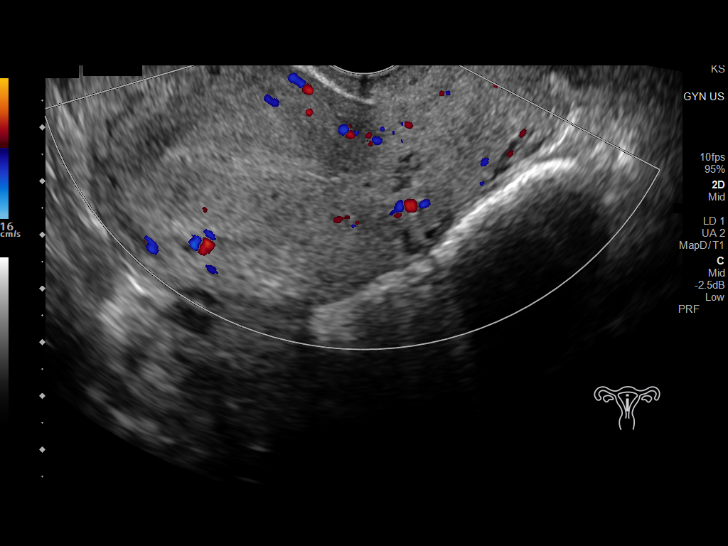
[im 36/58]
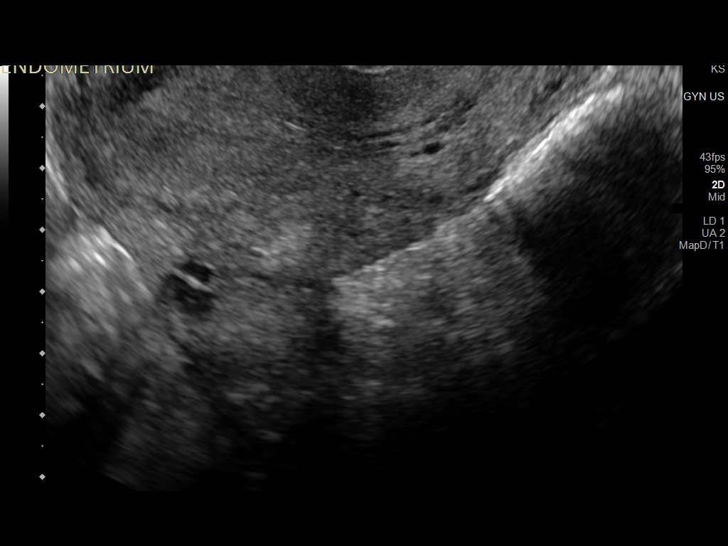
[im 39/58]
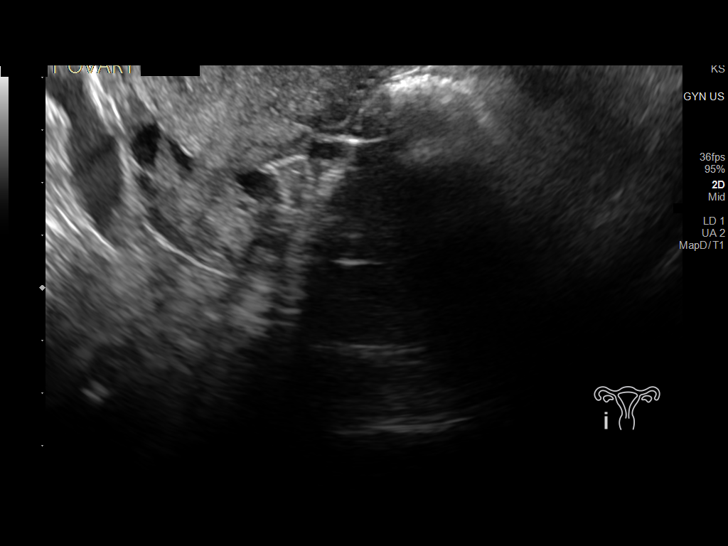
[im 43/58]
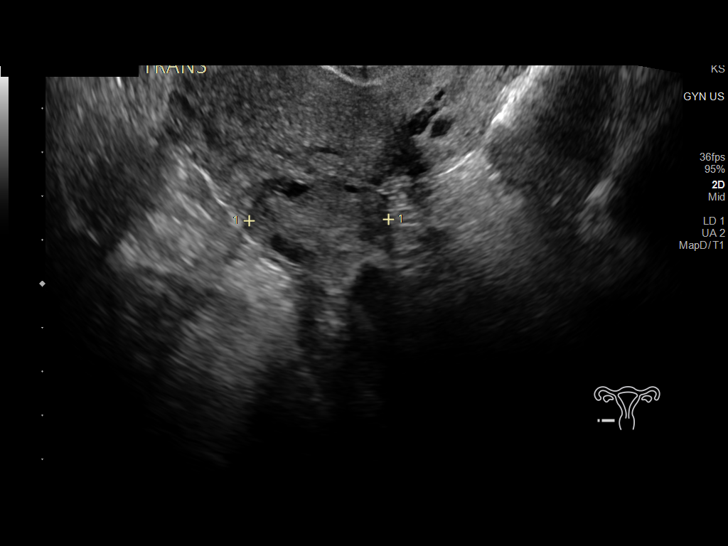
[im 48/58]
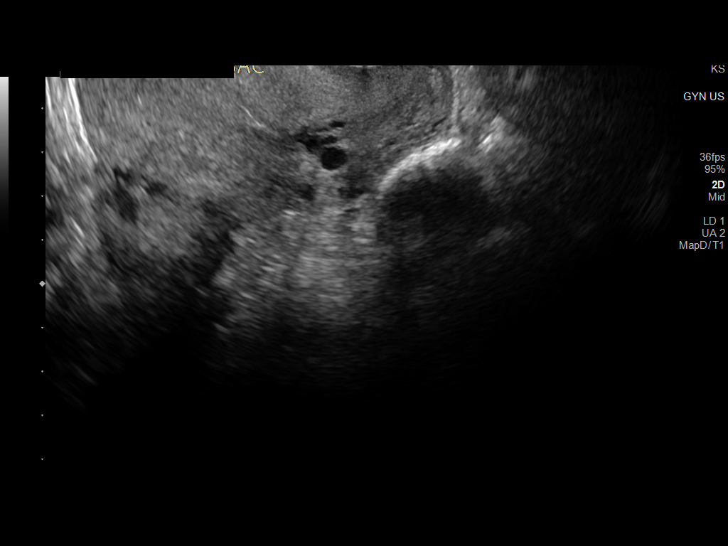
[im 53/58]
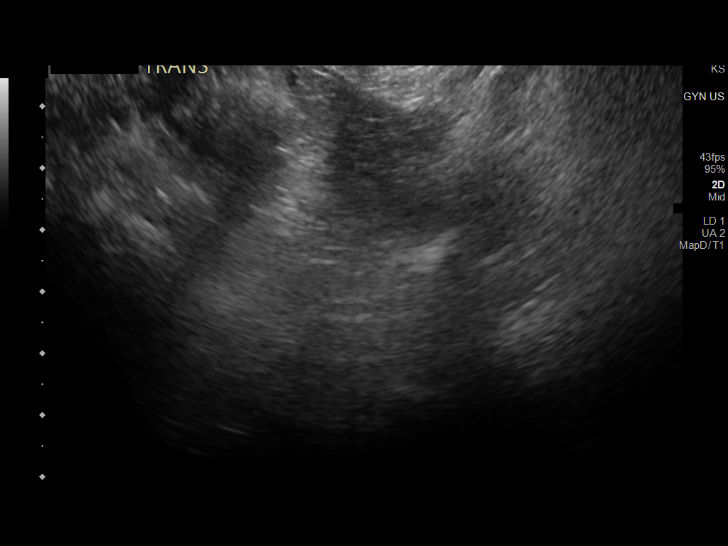
[im 58/58]
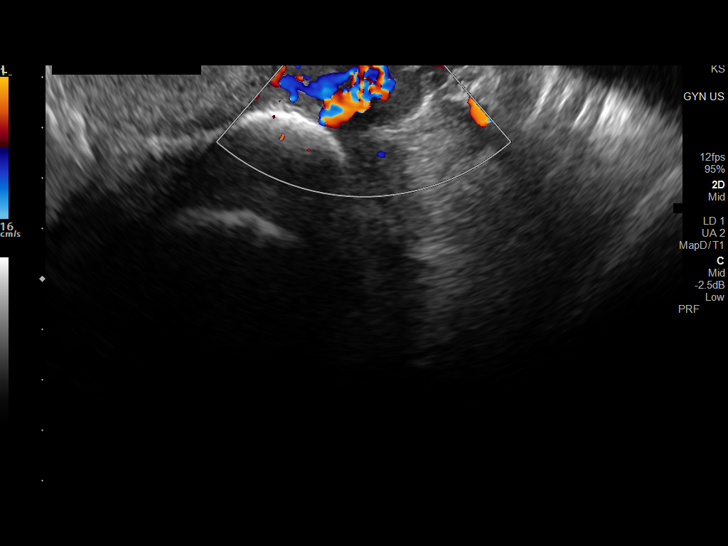

[14 of 25 positions shown; findings below may reference images not displayed]

FINDINGS: Uterus

Measurements: 9.9 x 5.3 x 4.9 cm = volume: 135 mL. No fibroids or
other mass visualized.

Endometrium

Thickness: 6.7 mm.  No focal abnormality visualized.

Right ovary

Measurements: 4.3 x 3.2 x 2.0 cm = volume: 14 mL. Normal
appearance/no adnexal mass.

Left ovary

Measurements: 3.8 x 2.5 x 1.8 cm = volume: 9 mL. Normal
appearance/no adnexal mass.

Other findings

No abnormal free fluid.
IMPRESSION: Normal examination.
